# Patient Record
Sex: Female | Born: 1985
Health system: Southern US, Community
[De-identification: ages and names within clinical notes are randomized; demographics above are authoritative.]

## PROBLEM LIST (undated history)

## (undated) DIAGNOSIS — D649 Anemia, unspecified: Secondary | ICD-10-CM

## (undated) HISTORY — PX: NO PAST SURGERIES: SHX2092

---

## 2014-12-03 NOTE — L&D Delivery Note (Signed)
0230: In room to start pushing with patient.  Patient remain C/C/+1 despite laboring down for one hour.  Patient instructed on pushing methods and displayed great efforts.  However, infant noted to have variable decelerations with every contraction and after 30 minutes resuscitative measures were initiated due to a prolonged deceleration. At this time, Dr. Katharine LookJ. Ozan called for possible vacuum extraction and patient and husband given r/b of procedure. Pushing efforts stopped and infant heart rate recovered, but was noted to be tachycardiac. Dr. Katharine LookJ. Ozan arrived after ~ 10 minutes and assessed patient and reviewed r/b of vacuum extraction. At that time, patient encouraged to continue pushing and delivered as below with staff and husband support.  Dr. Katharine LookJ. Ozan remained at bedside for support, but this provider completed delivery.    Delivery Note At 3:40 AM, on July 8th, 2016, a viable female "Name Undecided" was delivered via Vaginal, Spontaneous Delivery (Presentation: Direct Occiput Posterior with restitution to LOP).  Shoulders delivered easily and infant with good tone and spontaneous cry. Tactile stimulation and bulb suction given by provider and infant placed on mother's abdomen where nurse continued tactile stimulation. Infant APGAR: 8,9. Cord clamped, cut, and blood collected. Placenta delivered spontaneously and noted to be intact with 3VC upon inspection.  Vaginal inspection revealed a 2nd degree laceration, which was confirmed by Dr. Katharine LookJ. Ozan. Repair made without need for additional anesthetic and 2.0 vicryl on a CT and 3.0 vicryl on SH and CT.  Patient tolerated well. Fundus firm, at the umbilicus, and bleeding small.  Mother hemodynamically stable and infant skin to skin prior to provider exit.  Mother desires birth control pills for contraception method and opts to breastfeed.   Family unsure of circumcision plans as of current. Infant weight at one hour of life: 9lbs 4.9oz.    Anesthesia: Epidural   Episiotomy:  None Lacerations: 2nd degree;Perineal Suture Repair: 3.0 vicryl Est. Blood Loss (mL):  72  Mom to postpartum.  Baby to Couplet care / Skin to Skin.  Wendi Lastra LYNN MSN, CNM 06/10/2015, 4:36 AM

## 2015-01-14 LAB — OB RESULTS CONSOLE RUBELLA ANTIBODY, IGM: Rubella: IMMUNE

## 2015-01-14 LAB — OB RESULTS CONSOLE HIV ANTIBODY (ROUTINE TESTING): HIV: NONREACTIVE

## 2015-01-14 LAB — OB RESULTS CONSOLE HEPATITIS B SURFACE ANTIGEN: Hepatitis B Surface Ag: NEGATIVE

## 2015-01-14 LAB — OB RESULTS CONSOLE RPR: RPR: NONREACTIVE

## 2015-01-14 LAB — OB RESULTS CONSOLE ABO/RH: RH TYPE: POSITIVE

## 2015-01-14 LAB — OB RESULTS CONSOLE GC/CHLAMYDIA
Chlamydia: NEGATIVE
Gonorrhea: NEGATIVE

## 2015-01-14 LAB — OB RESULTS CONSOLE ANTIBODY SCREEN: Antibody Screen: NEGATIVE

## 2015-03-03 ENCOUNTER — Other Ambulatory Visit (HOSPITAL_COMMUNITY): Payer: Self-pay | Admitting: Obstetrics and Gynecology

## 2015-03-03 DIAGNOSIS — IMO0002 Reserved for concepts with insufficient information to code with codable children: Secondary | ICD-10-CM

## 2015-03-03 DIAGNOSIS — Z0489 Encounter for examination and observation for other specified reasons: Secondary | ICD-10-CM

## 2015-03-03 DIAGNOSIS — O0932 Supervision of pregnancy with insufficient antenatal care, second trimester: Secondary | ICD-10-CM

## 2015-03-03 DIAGNOSIS — O359XX0 Maternal care for (suspected) fetal abnormality and damage, unspecified, not applicable or unspecified: Secondary | ICD-10-CM

## 2015-03-17 ENCOUNTER — Ambulatory Visit (HOSPITAL_COMMUNITY): Payer: BLUE CROSS/BLUE SHIELD

## 2015-05-20 LAB — OB RESULTS CONSOLE GBS: GBS: POSITIVE

## 2015-06-09 ENCOUNTER — Encounter (HOSPITAL_COMMUNITY): Payer: Self-pay

## 2015-06-09 ENCOUNTER — Inpatient Hospital Stay (HOSPITAL_COMMUNITY)
Admission: AD | Admit: 2015-06-09 | Discharge: 2015-06-11 | DRG: 775 | Disposition: A | Discharge status: Home / Self Care | Payer: BLUE CROSS/BLUE SHIELD | Source: Ambulatory Visit | Attending: Obstetrics & Gynecology | Admitting: Obstetrics & Gynecology

## 2015-06-09 DIAGNOSIS — O0933 Supervision of pregnancy with insufficient antenatal care, third trimester: Secondary | ICD-10-CM

## 2015-06-09 DIAGNOSIS — D649 Anemia, unspecified: Secondary | ICD-10-CM | POA: Diagnosis present

## 2015-06-09 DIAGNOSIS — O9902 Anemia complicating childbirth: Secondary | ICD-10-CM | POA: Diagnosis present

## 2015-06-09 DIAGNOSIS — O3663X0 Maternal care for excessive fetal growth, third trimester, not applicable or unspecified: Secondary | ICD-10-CM | POA: Diagnosis present

## 2015-06-09 DIAGNOSIS — B951 Streptococcus, group B, as the cause of diseases classified elsewhere: Secondary | ICD-10-CM | POA: Diagnosis present

## 2015-06-09 DIAGNOSIS — O99824 Streptococcus B carrier state complicating childbirth: Secondary | ICD-10-CM | POA: Diagnosis present

## 2015-06-09 DIAGNOSIS — Z3A39 39 weeks gestation of pregnancy: Secondary | ICD-10-CM | POA: Diagnosis present

## 2015-06-09 HISTORY — DX: Anemia, unspecified: D64.9

## 2015-06-09 NOTE — H&P (Signed)
Michele Gray is a 29 y.o. female, G3P2002 at 39.3 weeks, presenting for LOF and contractions.  Patient report SROM at 2315 with onset of regular contractions while en route to the hospital.  Patient is GBS positive.  Patient desires epidural for pain mgmt.   Patient Active Problem List   Diagnosis Date Noted  . Active labor at term 06/10/2015    History of present pregnancy: Patient entered care at 9.1 weeks, but only went for initial visit and received PE and dating US at facility in [place].  Patient transferred to [facility] at 19.3wks   [facility] by 9.1wk US on 11/09/2014.   Anatomy scan:  22.1 weeks, with normal findings and an anterior placenta.   Additional US evaluations:   -Anatomy scan. EFW= 1 lb 4 oz. = 89%tile. FHT's 148.Vertex pres. Anterior placenta. No previa. Placental edge to cx= 13.0 cm. PCI seen today.. Normal fluid.AP pocket=5.7 cm. Meas: are c/w established GA.Marland Kitchen. Anatomy: Open hands, 5th digit , NB seen. AA and DA seen.. Parents do not want to know gender. Anatomy not well seen due to fetal position: spine and CSP.Marland Kitchen. Cx closed. Adnexas unremarkable.   -25.1wks: F/U US: SIUP, NORMAL FLUID, CX 4.37 CM, GROWTH 81%, CAVUM SEPTUM PELLUCIDUM NOT CLEARLY SEEN, BOTH VENTRICLES ARE UPPER LIMITS NORMAL SIZE -36.4wks: U/S today vertex, normal fluid. EFW 93% Significant prenatal events:  [facility]  Patient c/o daily irregular contractions between 7-10 pm and leg cramping. Pt. voiced that feet are swelling more than usual, right ankle was hurting and describes as shooting pains up legs. Pt c/o occ pelvic/ lower back pain. Patient failed 1 hr GTT, but had a normal 3 hr.  Last evaluation:  06/09/2015   39 wks 3 days   By Dr. Lance MorinA.Roberts FHR: 146, SVE: 4cm / 80% / -2 BP: 112/60  Wt: 171 lbs  OB History    Gravida Para Term Preterm AB TAB SAB Ectopic Multiple Living   3 2 2       2     04/2011 Female-SVD with Epidural 05/2013 Female-SVD with Epidural  No past medical history  on file. No past surgical history on file. Family History: family history is not on file. Social History:  has no tobacco, alcohol, and drug history on file.   Prenatal Transfer Tool  Maternal Diabetes: No Genetic Screening: Normal Maternal Ultrasounds/Referrals: Normal, but limited and MFM consult recommended, but patient declined Fetal Ultrasounds or other Referrals:  None Maternal Substance Abuse:  No Significant Maternal Medications:  None Significant Maternal Lab Results: Lab values include: Group B Strep positive    ROS:  +Ctx, +LoF, +FM, -VB  Allergies  Allergen Reactions  . Flagyl [Metronidazole]        Blood pressure 123/70, pulse 69, temperature 98.1 F (36.7 C), temperature source Oral, resp. rate 18.  Physical Exam  Vitals reviewed. Constitutional: She is oriented to person, place, and time. She appears well-developed and well-nourished. She appears distressed (During Ctx).  HENT:  Head: Normocephalic and atraumatic.  Eyes: EOM are normal.  Neck: Normal range of motion.  Cardiovascular: Normal rate.   Respiratory: Effort normal.  GI: Soft.  Musculoskeletal: Normal range of motion. She exhibits no edema.  Neurological: She is alert and oriented to person, place, and time.  Skin: Skin is warm and dry.   SVE: 6/80/-2 +Fern--SROM at 2315 FHR: 145 bpm, Mod Var, -Decels, +Accels UCs:  Q1-563min, palpates moderate  Prenatal labs: ABO, Rh: A/Positive/-- (02/12 0000) Antibody:  Negative (02/12 0000) Rubella:   Immune RPR: Nonreactive (02/12 0000)  HBsAg: Negative (02/12 0000)  HIV: Non-reactive (02/12 0000)  GBS: Positive (06/17 0000) Sickle cell/Hgb electrophoresis:  N/A Pap:  Unknown GC:  Negative Chlamydia:  Negative Other:  TSH-Normal    Assessment IUP at 39.4wks Cat I FT GBS Positive Active Labor SROM Anemia  Plan: Admit to [employer] Routine Labor and Delivery Orders per [facility] Protocol In room to complete assessment and discuss  POC: -Okay for epidural -Will Vancomycin for GBS prophylaxis Dr. Katharine Look to be updated as appropriate  Phillips Climes, MSN 06/09/2015, 11:41 PM

## 2015-06-10 ENCOUNTER — Inpatient Hospital Stay (HOSPITAL_COMMUNITY): Payer: BLUE CROSS/BLUE SHIELD | Admitting: Anesthesiology

## 2015-06-10 ENCOUNTER — Encounter (HOSPITAL_COMMUNITY): Payer: Self-pay | Admitting: *Deleted

## 2015-06-10 LAB — CBC
HCT: 36 % (ref 36.0–46.0)
HEMOGLOBIN: 12 g/dL (ref 12.0–15.0)
MCH: 30 pg (ref 26.0–34.0)
MCHC: 33.3 g/dL (ref 30.0–36.0)
MCV: 90 fL (ref 78.0–100.0)
Platelets: 138 10*3/uL — ABNORMAL LOW (ref 150–400)
RBC: 4 MIL/uL (ref 3.87–5.11)
RDW: 14.1 % (ref 11.5–15.5)
WBC: 8.9 10*3/uL (ref 4.0–10.5)

## 2015-06-10 LAB — TYPE AND SCREEN
ABO/RH(D): A POS
Antibody Screen: NEGATIVE

## 2015-06-10 LAB — ABO/RH: ABO/RH(D): A POS

## 2015-06-10 LAB — RPR: RPR: NONREACTIVE

## 2015-06-10 MED ORDER — ONDANSETRON HCL 4 MG PO TABS: 4.0000 mg | ORAL_TABLET | ORAL | Status: DC | PRN

## 2015-06-10 MED ORDER — SENNOSIDES-DOCUSATE SODIUM 8.6-50 MG PO TABS
2.0000 | ORAL_TABLET | ORAL | Status: DC
  Administered 2015-06-10: 2 via ORAL
  Filled 2015-06-10: qty 2

## 2015-06-10 MED ORDER — DIPHENHYDRAMINE HCL 50 MG/ML IJ SOLN: 12.5000 mg | INTRAMUSCULAR | Status: DC | PRN

## 2015-06-10 MED ORDER — AMPICILLIN SODIUM 2 G IJ SOLR
2.0000 g | Freq: Once | INTRAMUSCULAR | Status: AC
  Administered 2015-06-10: 2 g via INTRAVENOUS
  Filled 2015-06-10: qty 2000

## 2015-06-10 MED ORDER — FENTANYL 2.5 MCG/ML BUPIVACAINE 1/10 % EPIDURAL INFUSION (WH - ANES)
14.0000 mL/h | INTRAMUSCULAR | Status: DC | PRN
  Administered 2015-06-10: 12 mL/h via EPIDURAL

## 2015-06-10 MED ORDER — IBUPROFEN 600 MG PO TABS
600.0000 mg | ORAL_TABLET | Freq: Four times a day (QID) | ORAL | Status: DC
  Administered 2015-06-10 – 2015-06-11 (×6): 600 mg via ORAL
  Filled 2015-06-10 (×6): qty 1

## 2015-06-10 MED ORDER — ACETAMINOPHEN 325 MG PO TABS: 650.0000 mg | ORAL_TABLET | ORAL | Status: DC | PRN

## 2015-06-10 MED ORDER — LIDOCAINE HCL (PF) 1 % IJ SOLN
30.0000 mL | INTRAMUSCULAR | Status: DC | PRN
  Filled 2015-06-10: qty 30

## 2015-06-10 MED ORDER — TETANUS-DIPHTH-ACELL PERTUSSIS 5-2.5-18.5 LF-MCG/0.5 IM SUSP: 0.5000 mL | Freq: Once | INTRAMUSCULAR | Status: DC

## 2015-06-10 MED ORDER — ONDANSETRON HCL 4 MG/2ML IJ SOLN: 4.0000 mg | Freq: Four times a day (QID) | INTRAMUSCULAR | Status: DC | PRN

## 2015-06-10 MED ORDER — OXYCODONE-ACETAMINOPHEN 5-325 MG PO TABS: 2.0000 | ORAL_TABLET | ORAL | Status: DC | PRN

## 2015-06-10 MED ORDER — LACTATED RINGERS IV SOLN: 500.0000 mL | INTRAVENOUS | Status: DC | PRN

## 2015-06-10 MED ORDER — LIDOCAINE HCL (PF) 1 % IJ SOLN
INTRAMUSCULAR | Status: AC
  Administered 2015-06-10: 30 mL
  Filled 2015-06-10: qty 30

## 2015-06-10 MED ORDER — SIMETHICONE 80 MG PO CHEW: 80.0000 mg | CHEWABLE_TABLET | ORAL | Status: DC | PRN

## 2015-06-10 MED ORDER — ZOLPIDEM TARTRATE 5 MG PO TABS: 5.0000 mg | ORAL_TABLET | Freq: Every evening | ORAL | Status: DC | PRN

## 2015-06-10 MED ORDER — FENTANYL CITRATE (PF) 100 MCG/2ML IJ SOLN: 50.0000 ug | INTRAMUSCULAR | Status: DC | PRN

## 2015-06-10 MED ORDER — OXYCODONE-ACETAMINOPHEN 5-325 MG PO TABS: 1.0000 | ORAL_TABLET | ORAL | Status: DC | PRN

## 2015-06-10 MED ORDER — DIPHENHYDRAMINE HCL 25 MG PO CAPS: 25.0000 mg | ORAL_CAPSULE | Freq: Four times a day (QID) | ORAL | Status: DC | PRN

## 2015-06-10 MED ORDER — OXYTOCIN BOLUS FROM INFUSION
500.0000 mL | INTRAVENOUS | Status: DC
  Administered 2015-06-10: 500 mL via INTRAVENOUS

## 2015-06-10 MED ORDER — OXYTOCIN 40 UNITS IN LACTATED RINGERS INFUSION - SIMPLE MED: 62.5000 mL/h | INTRAVENOUS | Status: DC

## 2015-06-10 MED ORDER — BENZOCAINE-MENTHOL 20-0.5 % EX AERO
1.0000 "application " | INHALATION_SPRAY | CUTANEOUS | Status: DC | PRN
  Filled 2015-06-10: qty 56

## 2015-06-10 MED ORDER — CITRIC ACID-SODIUM CITRATE 334-500 MG/5ML PO SOLN: 30.0000 mL | ORAL | Status: DC | PRN

## 2015-06-10 MED ORDER — DIBUCAINE 1 % RE OINT: 1.0000 "application " | TOPICAL_OINTMENT | RECTAL | Status: DC | PRN

## 2015-06-10 MED ORDER — EPHEDRINE 5 MG/ML INJ
10.0000 mg | INTRAVENOUS | Status: DC | PRN
  Filled 2015-06-10: qty 2

## 2015-06-10 MED ORDER — PHENYLEPHRINE 40 MCG/ML (10ML) SYRINGE FOR IV PUSH (FOR BLOOD PRESSURE SUPPORT)
80.0000 ug | PREFILLED_SYRINGE | INTRAVENOUS | Status: DC | PRN
  Filled 2015-06-10: qty 2

## 2015-06-10 MED ORDER — WITCH HAZEL-GLYCERIN EX PADS: 1.0000 "application " | MEDICATED_PAD | CUTANEOUS | Status: DC | PRN

## 2015-06-10 MED ORDER — BUPIVACAINE HCL (PF) 0.25 % IJ SOLN
INTRAMUSCULAR | Status: DC | PRN
  Administered 2015-06-10 (×2): 4 mL

## 2015-06-10 MED ORDER — ONDANSETRON HCL 4 MG/2ML IJ SOLN: 4.0000 mg | INTRAMUSCULAR | Status: DC | PRN

## 2015-06-10 MED ORDER — PHENYLEPHRINE 40 MCG/ML (10ML) SYRINGE FOR IV PUSH (FOR BLOOD PRESSURE SUPPORT)
PREFILLED_SYRINGE | INTRAVENOUS | Status: AC
  Filled 2015-06-10: qty 20

## 2015-06-10 MED ORDER — LACTATED RINGERS IV SOLN: INTRAVENOUS | Status: DC

## 2015-06-10 MED ORDER — FENTANYL 2.5 MCG/ML BUPIVACAINE 1/10 % EPIDURAL INFUSION (WH - ANES)
INTRAMUSCULAR | Status: AC
  Filled 2015-06-10: qty 125

## 2015-06-10 MED ORDER — PRENATAL MULTIVITAMIN CH
1.0000 | ORAL_TABLET | Freq: Every day | ORAL | Status: DC
  Administered 2015-06-10 – 2015-06-11 (×2): 1 via ORAL
  Filled 2015-06-10 (×2): qty 1

## 2015-06-10 MED ORDER — LIDOCAINE-EPINEPHRINE (PF) 2 %-1:200000 IJ SOLN
INTRAMUSCULAR | Status: DC | PRN
  Administered 2015-06-10: 3 mL

## 2015-06-10 MED ORDER — OXYTOCIN 40 UNITS IN LACTATED RINGERS INFUSION - SIMPLE MED
INTRAVENOUS | Status: AC
  Filled 2015-06-10: qty 1000

## 2015-06-10 MED ORDER — LANOLIN HYDROUS EX OINT: TOPICAL_OINTMENT | CUTANEOUS | Status: DC | PRN

## 2015-06-10 NOTE — Anesthesia Preprocedure Evaluation (Signed)

## 2015-06-10 NOTE — Progress Notes (Signed)
Erline HauRuhani Davidoff MRN: [id number]  Subjective: -Patient reports comfort with epidural.  Denies feeling of vaginal and/or rectal pressure.    Objective: BP 115/70 mmHg  Pulse 58  Temp(Src) 98.1 F (36.7 C) (Oral)  Resp 18  Ht 5\' 6"  (1.676 m)  Wt 77.111 kg (170 lb)  BMI 27.45 kg/m2  SpO2 99%     FHT: 150 bpm, Mod Var, -Decels, +Accels UC: Q1-643min, palpates moderate   SVE:   Dilation: 10 Effacement (%): 100 Station: +1 Exam by:: Willadene Mounsey, CNM Membranes:SROM Pitocin:None  Assessment:  IUP at 39.4wks Cat I FT  Active Labor-2nd Stage  Plan: -Allow to labor down for 30-60 min -Continue other mgmt as ordered -Anticipate SVD  Cassara Nida LYNN,MSN, CNM 06/10/2015, 1:33 AM

## 2015-06-10 NOTE — Lactation Note (Signed)
This note was copied from the chart of Boy Marithza Alkins. Lactation Consultation Note  Patient Name: Boy Erline HauRuhani Schlag ZOXWR'UToday's Date: 06/10/2015 Reason for consult: Initial assessment Baby 6 hours old. Mom reports that she nursed her first two children over 6 months. Mom states that she ended up with sore, cracked, and bleeding nipples with second child, so she will call for assistance with next feeding for someone to see a latch. Enc mom to offer lots of STS and nurse with cues. Mom given [facility] brochure, aware of OP/BFSG, community resources, and [facility] phone [facility] assistance after D/C.   Maternal Data Has patient been taught Hand Expression?: Yes (Per mom.) Does the patient have breastfeeding experience prior to this delivery?: Yes  Feeding Feeding Type: Breast Fed Length of feed: 15 min  LATCH Score/Interventions                      Lactation Tools Discussed/Used     Consult Status Consult Status: Follow-up Date: 06/11/15 Follow-up type: In-patient    Geralynn OchsWILLIARD, Flo Berroa 06/10/2015, 10:39 AM

## 2015-06-10 NOTE — Lactation Note (Signed)
This note was copied from the chart of Michele [name]. Lactation Consultation Note  Patient Name: Michele [name]: 06/10/2015 Reason for consult: Follow-up assessment Baby 7 hours old. Mom called for assistance with latching. Assisted mom to latch baby in cross-cradle position to right breast. Baby latch deeply after two attempts, suckling rhythmically, with a few swallows noted. Demonstrated to parents how to flange lower lip and mom reported increased comfort. Enc mom to nurse with cues, and discussed cluster-feeding. Enc mom to call for assistance as needed.   Maternal Data Has patient been taught Hand Expression?: Yes (Per mom.) Does the patient have breastfeeding experience prior to this delivery?: Yes  Feeding Feeding Type: Breast Fed  LATCH Score/Interventions Latch: Grasps breast easily, tongue down, lips flanged, rhythmical sucking.  Audible Swallowing: A few with stimulation  Type of Nipple: Everted at rest and after stimulation  Comfort (Breast/Nipple): Soft / non-tender     Hold (Positioning): Assistance needed to correctly position infant at breast and maintain latch. Intervention(s): Breastfeeding basics reviewed;Support Pillows;Position options;Skin to skin  LATCH Score: 8  Lactation Tools Discussed/Used     Consult Status Consult Status: Follow-up [name]: 06/11/15 Follow-up type: In-patient    Geralynn OchsWILLIARD, Sueann Brownley 06/10/2015, 11:25 AM

## 2015-06-10 NOTE — Anesthesia Procedure Notes (Signed)
Epidural Patient location during procedure: OB  Staffing Anesthesiologist: Rosette Bellavance, CHRIS Performed by: anesthesiologist   Preanesthetic Checklist Completed: patient identified, surgical consent, pre-op evaluation, timeout performed, IV checked, risks and benefits discussed and monitors and equipment checked  Epidural Patient position: sitting Prep: site prepped and draped and DuraPrep Patient monitoring: heart rate, cardiac monitor, continuous pulse ox and blood pressure Approach: midline Location: L5-S1 Injection technique: LOR saline  Needle:  Needle type: Tuohy  Needle gauge: 17 G Needle length: 9 cm Needle insertion depth: 6 cm Catheter type: closed end flexible Catheter size: 19 Gauge Catheter at skin depth: 12 cm Test dose: negative and 2% lidocaine with Epi 1:200 K  Assessment Events: blood not aspirated, injection not painful, no injection resistance, negative IV test and no paresthesia  Additional Notes H+P and labs checked, risks and benefits discussed with the patient, consent obtained, procedure tolerated well and without complications.  Reason for block:procedure for pain   

## 2015-06-10 NOTE — Progress Notes (Signed)
Patient complaining of left eyelid being swollen, itchy. Minimal swelling of left upper eyelid noted, very slight redness;, minimal redness of proximal sclera. Vision wnl, pupils equal. Recommended warm compress for comfort and will  Monitor this evening

## 2015-06-11 DIAGNOSIS — B951 Streptococcus, group B, as the cause of diseases classified elsewhere: Secondary | ICD-10-CM | POA: Diagnosis present

## 2015-06-11 LAB — CBC
HCT: 30.6 % — ABNORMAL LOW (ref 36.0–46.0)
Hemoglobin: 9.8 g/dL — ABNORMAL LOW (ref 12.0–15.0)
MCH: 29.5 pg (ref 26.0–34.0)
MCHC: 32 g/dL (ref 30.0–36.0)
MCV: 92.2 fL (ref 78.0–100.0)
Platelets: 119 10*3/uL — ABNORMAL LOW (ref 150–400)
RBC: 3.32 MIL/uL — ABNORMAL LOW (ref 3.87–5.11)
RDW: 14.3 % (ref 11.5–15.5)
WBC: 10.6 10*3/uL — AB (ref 4.0–10.5)

## 2015-06-11 MED ORDER — IBUPROFEN 600 MG PO TABS: 600.0000 mg | ORAL_TABLET | Freq: Four times a day (QID) | ORAL | Status: DC | PRN

## 2015-06-11 MED ORDER — NORETHINDRONE 0.35 MG PO TABS
1.0000 | ORAL_TABLET | Freq: Every day | ORAL | Status: DC
Start: 2015-06-11 — End: 2023-10-07

## 2015-06-11 MED ORDER — FERROUS SULFATE 325 (65 FE) MG PO TABS: 325.0000 mg | ORAL_TABLET | Freq: Every day | ORAL | Status: DC

## 2015-06-11 NOTE — Discharge Instructions (Signed)
Postpartum Care After Vaginal Delivery °After you deliver your newborn (postpartum period), the usual stay in the hospital is 24-72 hours. If there were problems with your labor or delivery, or if you have other medical problems, you might be in the hospital longer.  °While you are in the hospital, you will receive help and instructions on how to care for yourself and your newborn during the postpartum period.  °While you are in the hospital: °· Be sure to tell your nurses if you have pain or discomfort, as well as where you feel the pain and what makes the pain worse. °· If you had an incision made near your vagina (episiotomy) or if you had some tearing during delivery, the nurses may put ice packs on your episiotomy or tear. The ice packs may help to reduce the pain and swelling. °· If you are breastfeeding, you may feel uncomfortable contractions of your uterus for a couple of weeks. This is normal. The contractions help your uterus get back to normal size. °· It is normal to have some bleeding after delivery. °¨ For the first 1-3 days after delivery, the flow is red and the amount may be similar to a period. °¨ It is common for the flow to start and stop. °¨ In the first few days, you may pass some small clots. Let your nurses know if you begin to pass large clots or your flow increases. °¨ Do not  flush blood clots down the toilet before having the nurse look at them. °¨ During the next 3-10 days after delivery, your flow should become more watery and pink or brown-tinged in color. °¨ Ten to fourteen days after delivery, your flow should be a small amount of yellowish-white discharge. °¨ The amount of your flow will decrease over the first few weeks after delivery. Your flow may stop in 6-8 weeks. Most women have had their flow stop by 12 weeks after delivery. °· You should change your sanitary pads frequently. °· Wash your hands thoroughly with soap and water for at least 20 seconds after changing pads, using  the toilet, or before holding or feeding your newborn. °· You should feel like you need to empty your bladder within the first 6-8 hours after delivery. °· In case you become weak, lightheaded, or faint, call your nurse before you get out of bed for the first time and before you take a shower for the first time. °· Within the first few days after delivery, your breasts may begin to feel tender and full. This is called engorgement. Breast tenderness usually goes away within 48-72 hours after engorgement occurs. You may also notice milk leaking from your breasts. If you are not breastfeeding, do not stimulate your breasts. Breast stimulation can make your breasts produce more milk. °· Spending as much time as possible with your newborn is very important. During this time, you and your newborn can feel close and get to know each other. Having your newborn stay in your room (rooming in) will help to strengthen the bond with your newborn.  It will give you time to get to know your newborn and become comfortable caring for your newborn. °· Your hormones change after delivery. Sometimes the hormone changes can temporarily cause you to feel sad or tearful. These feelings should not last more than a few days. If these feelings last longer than that, you should talk to your caregiver. °· If desired, talk to your caregiver about methods of family planning or contraception. °·   Talk to your caregiver about immunizations. Your caregiver may want you to have the following immunizations before leaving the hospital:  Tetanus, diphtheria, and pertussis (Tdap) or tetanus and diphtheria (Td) immunization. It is very important that you and your family (including grandparents) or others caring for your newborn are up-to-date with the Tdap or Td immunizations. The Tdap or Td immunization can help protect your newborn from getting ill.  Rubella immunization.  Varicella (chickenpox) immunization.  Influenza immunization. You should  receive this annual immunization if you did not receive the immunization during your pregnancy. Document Released: 09/16/2007 Document Revised: 08/13/2012 Document Reviewed: 07/16/2012 [facility] Patient Information 2015 [employer] [place], [place]. This information is not intended to replace advice given to you by your health care provider. Make sure you discuss any questions you have with your health care provider.  Levonorgestrel intrauterine device (IUD) What is this medicine? LEVONORGESTREL IUD (LEE voe nor jes trel) is a contraceptive (birth control) device. The device is placed inside the uterus by a healthcare professional. It is used to prevent pregnancy and can also be used to treat heavy bleeding that occurs during your period. Depending on the device, it can be used for 3 to 5 years. This medicine may be used for other purposes; ask your health care provider or pharmacist if you have questions. COMMON BRAND NAME(S): Elveria RoyalsLILETTA, Mirena, Skyla What should I tell my health care provider before I take this medicine? They need to know if you have any of these conditions: -abnormal Pap smear -cancer of the breast, uterus, or cervix -diabetes -endometritis -genital or pelvic infection now or in the past -have more than one sexual partner or your partner has more than one partner -heart disease -history of an ectopic or tubal pregnancy -immune system problems -IUD in place -liver disease or tumor -problems with blood clots or take blood-thinners -use intravenous drugs -uterus of unusual shape -vaginal bleeding that has not been explained -an unusual or allergic reaction to levonorgestrel, other hormones, silicone, or polyethylene, medicines, foods, dyes, or preservatives -pregnant or trying to get pregnant -breast-feeding How should I use this medicine? This device is placed inside the uterus by a health care professional. Talk to your pediatrician regarding the use of this medicine in children.  Special care may be needed. Overdosage: If you think you have taken too much of this medicine contact a poison control center or emergency room at once. NOTE: This medicine is only for you. Do not share this medicine with others. What if I miss a dose? This does not apply. What may interact with this medicine? Do not take this medicine with any of the following medications: -amprenavir -bosentan -fosamprenavir This medicine may also interact with the following medications: -aprepitant -barbiturate medicines for inducing sleep or treating seizures -bexarotene -griseofulvin -medicines to treat seizures like carbamazepine, ethotoin, felbamate, oxcarbazepine, phenytoin, topiramate -modafinil -pioglitazone -rifabutin -rifampin -rifapentine -some medicines to treat HIV infection like atazanavir, indinavir, lopinavir, nelfinavir, tipranavir, ritonavir -St. John's wort -warfarin This list may not describe all possible interactions. Give your health care provider a list of all the medicines, herbs, non-prescription drugs, or dietary supplements you use. Also tell them if you smoke, drink alcohol, or use illegal drugs. Some items may interact with your medicine. What should I watch for while using this medicine? Visit your doctor or health care professional for regular check ups. See your doctor if you or your partner has sexual contact with others, becomes HIV positive, or gets a sexual transmitted disease. This product  does not protect you against HIV infection (AIDS) or other sexually transmitted diseases. You can check the placement of the IUD yourself by reaching up to the top of your vagina with clean fingers to feel the threads. Do not pull on the threads. It is a good habit to check placement after each menstrual period. Call your doctor right away if you feel more of the IUD than just the threads or if you cannot feel the threads at all. The IUD may come out by itself. You may become  pregnant if the device comes out. If you notice that the IUD has come out use a backup birth control method like condoms and call your health care provider. Using tampons will not change the position of the IUD and are okay to use during your period. What side effects may I notice from receiving this medicine? Side effects that you should report to your doctor or health care professional as soon as possible: -allergic reactions like skin rash, itching or hives, swelling of the face, lips, or tongue -fever, flu-like symptoms -genital sores -high blood pressure -no menstrual period for 6 weeks during use -pain, swelling, warmth in the leg -pelvic pain or tenderness -severe or sudden headache -signs of pregnancy -stomach cramping -sudden shortness of breath -trouble with balance, talking, or walking -unusual vaginal bleeding, discharge -yellowing of the eyes or skin Side effects that usually do not require medical attention (report to your doctor or health care professional if they continue or are bothersome): -acne -breast pain -change in sex drive or performance -changes in weight -cramping, dizziness, or faintness while the device is being inserted -headache -irregular menstrual bleeding within first 3 to 6 months of use -nausea This list may not describe all possible side effects. Call your doctor for medical advice about side effects. You may report side effects to FDA at 1-800-FDA-1088. Where should I keep my medicine? This does not apply. NOTE: This sheet is a summary. It may not cover all possible information. If you have questions about this medicine, talk to your doctor, pharmacist, or health care provider.  2015, [employer] Standard. (2011-12-20 13:54:04)  Etonogestrel implant What is this medicine? ETONOGESTREL (et oh noe JES trel) is a contraceptive (birth control) device. It is used to prevent pregnancy. It can be used for up to 3 years. This medicine may be used  for other purposes; ask your health care provider or pharmacist if you have questions. COMMON BRAND NAME(S): Implanon, Nexplanon What should I tell my health care provider before I take this medicine? They need to know if you have any of these conditions: -abnormal vaginal bleeding -blood vessel disease or blood clots -cancer of the breast, cervix, or liver -depression -diabetes -gallbladder disease -headaches -heart disease or recent heart attack -high blood pressure -high cholesterol -kidney disease -liver disease -renal disease -seizures -tobacco smoker -an unusual or allergic reaction to etonogestrel, other hormones, anesthetics or antiseptics, medicines, foods, dyes, or preservatives -pregnant or trying to get pregnant -breast-feeding How should I use this medicine? This device is inserted just under the skin on the inner side of your upper arm by a health care professional. Talk to your pediatrician regarding the use of this medicine in children. Special care may be needed. Overdosage: If you think you've taken too much of this medicine contact a poison control center or emergency room at once. Overdosage: If you think you have taken too much of this medicine contact a poison control center or emergency room at  once. NOTE: This medicine is only for you. Do not share this medicine with others. What if I miss a dose? This does not apply. What may interact with this medicine? Do not take this medicine with any of the following medications: -amprenavir -bosentan -fosamprenavir This medicine may also interact with the following medications: -barbiturate medicines for inducing sleep or treating seizures -certain medicines for fungal infections like ketoconazole and itraconazole -griseofulvin -medicines to treat seizures like carbamazepine, felbamate, oxcarbazepine, phenytoin, topiramate -modafinil -phenylbutazone -rifampin -some medicines to treat HIV infection like  atazanavir, indinavir, lopinavir, nelfinavir, tipranavir, ritonavir -St. John's wort This list may not describe all possible interactions. Give your health care provider a list of all the medicines, herbs, non-prescription drugs, or dietary supplements you use. Also tell them if you smoke, drink alcohol, or use illegal drugs. Some items may interact with your medicine. What should I watch for while using this medicine? This product does not protect you against HIV infection (AIDS) or other sexually transmitted diseases. You should be able to feel the implant by pressing your fingertips over the skin where it was inserted. Tell your doctor if you cannot feel the implant. What side effects may I notice from receiving this medicine? Side effects that you should report to your doctor or health care professional as soon as possible: -allergic reactions like skin rash, itching or hives, swelling of the face, lips, or tongue -breast lumps -changes in vision -confusion, trouble speaking or understanding -dark urine -depressed mood -general ill feeling or flu-like symptoms -light-colored stools -loss of appetite, nausea -right upper belly pain -severe headaches -severe pain, swelling, or tenderness in the abdomen -shortness of breath, chest pain, swelling in a leg -signs of pregnancy -sudden numbness or weakness of the face, arm or leg -trouble walking, dizziness, loss of balance or coordination -unusual vaginal bleeding, discharge -unusually weak or tired -yellowing of the eyes or skin Side effects that usually do not require medical attention (Report these to your doctor or health care professional if they continue or are bothersome.): -acne -breast pain -changes in weight -cough -fever or chills -headache -irregular menstrual bleeding -itching, burning, and vaginal discharge -pain or difficulty passing urine -sore throat This list may not describe all possible side effects. Call your  doctor for medical advice about side effects. You may report side effects to FDA at 1-800-FDA-1088. Where should I keep my medicine? This drug is given in a hospital or clinic and will not be stored at home. NOTE: This sheet is a summary. It may not cover all possible information. If you have questions about this medicine, talk to your doctor, pharmacist, or health care provider.  2015, Else[employer] Standard. (2012-05-26 15:37:45)  Medroxyprogesterone injection [Contraceptive] What is this medicine? MEDROXYPROGESTERONE (me DROX ee proe JES te rone) contraceptive injections prevent pregnancy. They provide effective birth control for 3 months. Depo-subQ Provera 104 is also used for treating pain related to endometriosis. This medicine may be used for other purposes; ask your health care provider or pharmacist if you have questions. COMMON BRAND NAME(S): Depo-Provera, Depo-subQ Provera 104 What should I tell my health care provider before I take this medicine? They need to know if you have any of these conditions: -frequently drink alcohol -asthma -blood vessel disease or a history of a blood clot in the lungs or legs -bone disease such as osteoporosis -breast cancer -diabetes -eating disorder (anorexia nervosa or bulimia) -high blood pressure -HIV infection or AIDS -kidney disease -liver disease -mental depression -migraine -seizures (convulsions) -  stroke -tobacco smoker -vaginal bleeding -an unusual or allergic reaction to medroxyprogesterone, other hormones, medicines, foods, dyes, or preservatives -pregnant or trying to get pregnant -breast-feeding How should I use this medicine? Depo-Provera Contraceptive injection is given into a muscle. Depo-subQ Provera 104 injection is given under the skin. These injections are given by a health care professional. You must not be pregnant before getting an injection. The injection is usually given during the first 5 days after the start of a  menstrual period or 6 weeks after delivery of a baby. Talk to your pediatrician regarding the use of this medicine in children. Special care may be needed. These injections have been used in female children who have started having menstrual periods. Overdosage: If you think you have taken too much of this medicine contact a poison control center or emergency room at once. NOTE: This medicine is only for you. Do not share this medicine with others. What if I miss a dose? Try not to miss a dose. You must get an injection once every 3 months to maintain birth control. If you cannot keep an appointment, call and reschedule it. If you wait longer than 13 weeks between Depo-Provera contraceptive injections or longer than 14 weeks between Depo-subQ Provera 104 injections, you could get pregnant. Use another method for birth control if you miss your appointment. You may also need a pregnancy test before receiving another injection. What may interact with this medicine? Do not take this medicine with any of the following medications: -bosentan This medicine may also interact with the following medications: -aminoglutethimide -antibiotics or medicines for infections, especially rifampin, rifabutin, rifapentine, and griseofulvin -aprepitant -barbiturate medicines such as phenobarbital or primidone -bexarotene -carbamazepine -medicines for seizures like ethotoin, felbamate, oxcarbazepine, phenytoin, topiramate -modafinil -St. John's wort This list may not describe all possible interactions. Give your health care provider a list of all the medicines, herbs, non-prescription drugs, or dietary supplements you use. Also tell them if you smoke, drink alcohol, or use illegal drugs. Some items may interact with your medicine. What should I watch for while using this medicine? This drug does not protect you against HIV infection (AIDS) or other sexually transmitted diseases. Use of this product may cause you to  lose calcium from your bones. Loss of calcium may cause weak bones (osteoporosis). Only use this product for more than 2 years if other forms of birth control are not right for you. The longer you use this product for birth control the more likely you will be at risk for weak bones. Ask your health care professional how you can keep strong bones. You may have a change in bleeding pattern or irregular periods. Many females stop having periods while taking this drug. If you have received your injections on time, your chance of being pregnant is very low. If you think you may be pregnant, see your health care professional as soon as possible. Tell your health care professional if you want to get pregnant within the next year. The effect of this medicine may last a long time after you get your last injection. What side effects may I notice from receiving this medicine? Side effects that you should report to your doctor or health care professional as soon as possible: -allergic reactions like skin rash, itching or hives, swelling of the face, lips, or tongue -breast tenderness or discharge -breathing problems -changes in vision -depression -feeling faint or lightheaded, falls -fever -pain in the abdomen, chest, groin, or leg -problems with balance, talking,  walking -unusually weak or tired -yellowing of the eyes or skin Side effects that usually do not require medical attention (report to your doctor or health care professional if they continue or are bothersome): -acne -fluid retention and swelling -headache -irregular periods, spotting, or absent periods -temporary pain, itching, or skin reaction at site where injected -weight gain This list may not describe all possible side effects. Call your doctor for medical advice about side effects. You may report side effects to FDA at 1-800-FDA-1088. Where should I keep my medicine? This does not apply. The injection will be given to you by a health  care professional. NOTE: This sheet is a summary. It may not cover all possible information. If you have questions about this medicine, talk to your doctor, pharmacist, or health care provider.  2015, Elsevier/Gold Standard. (2008-12-10 18:37:56)

## 2015-06-11 NOTE — Lactation Note (Signed)
This note was copied from the chart of Michele Akasha Wessells. Lactation Consultation Note; Mom reports that baby was fussy through the night and she's not sure she has any milk. Baby asleep in bassinet and has not fed in 3 hours- suggested unwrapping baby and getting him skin to skin to see if he will nurse. Baby awakened and latched well- untucked bottom lip and mom reports tugging, no pain with latch. Reports she had very sore nipples with last baby and wants to do better this time. Reviewed cluster feeding and encouraged to feed whenever she sees feeding cues. No questions at present. To call prn  Patient Name: Michele Gray'UToday's Date: 06/11/2015 Reason for consult: Follow-up assessment   Maternal Data Formula Feeding for Exclusion: No Has patient been taught Hand Expression?: Yes Does the patient have breastfeeding experience prior to this delivery?: Yes  Feeding Feeding Type: Breast Fed  LATCH Score/Interventions Latch: Grasps breast easily, tongue down, lips flanged, rhythmical sucking.  Audible Swallowing: A few with stimulation  Type of Nipple: Everted at rest and after stimulation  Comfort (Breast/Nipple): Soft / non-tender     Hold (Positioning): Assistance needed to correctly position infant at breast and maintain latch. Intervention(s): Breastfeeding basics reviewed;Support Pillows  LATCH Score: 8  Lactation Tools Discussed/Used     Consult Status Consult Status: Follow-up Date: 06/12/15 Follow-up type: In-patient    Pamelia HoitWeeks, Adelee Hannula D 06/11/2015, 11:36 AM

## 2015-06-11 NOTE — Discharge Summary (Signed)
  Vaginal Delivery Discharge Summary  Michele Gray  DOB:    Oct 23, 1986 MRN:    [id number] CSN:    [id number]  Date of admission:                  06/09/15  Date of discharge:                   06/11/15  Procedures this admission:   SVB, repair of 2nd degree perineal laceration  Date of Delivery: 06/10/15  Newborn Data:  Live born female  Birth Weight: 9 lb 4.9 oz (4221 g) APGAR: 8, 9  Home with mother.  Circumcision Plan: Outpatient  History of Present Illness:  Michele Gray is a 29 y.o. female, G3P3003, who presents at 767w4d weeks gestation. [facility]. She was admitted for onset of labor and rupture of membranes. Her pregnancy has been complicated by:  Patient Active Problem List   Diagnosis Date Noted  . Positive GBS test 06/11/2015  . LGA (large for gestational age) infant 06/11/2015  . SVD (spontaneous vaginal delivery) 06/10/2015  . Second-degree perineal laceration, with delivery 06/10/2015     Hospital Course:   Admitting Dx:  IUP at 39 4/7 weeks, GBS positive, active labor, SROM GBS Status:  Positive Delivering Clinician: Gerrit HeckJessica Emly, CNM, with Dr. Charlotta Newtonzan at bedside for possible need for VE Lacerations/MLE: 2nd degree perineal laceration Complications: None  Intrapartum Procedures: spontaneous vaginal delivery Postpartum Procedures: none Complications-Operative and Postpartum: 2nd degree perineal laceration  Discharge Diagnoses: Term Pregnancy-delivered, SVB, GBS positive, mild anemia  Feeding:  breast  Contraception:  oral progesterone-only contraceptive--requested information on other methods.  Home with info on IUD, Nexplanon, and Depo in d/c instructions.  Hemoglobin Results:  CBC Latest Ref Rng 06/11/2015 06/09/2015  WBC 4.0 - 10.5 K/uL 10.6(H) 8.9  Hemoglobin 12.0 - 15.0 g/dL 8.4(X9.8(L) 32.412.0  Hematocrit 36.0 - 46.0 % 30.6(L) 36.0  Platelets 150 -  400 K/uL 119(L) 138(L)    Discharge Physical Exam:   General: alert Lochia: appropriate Uterine Fundus: firm Incision: healing well DVT Evaluation: No evidence of DVT seen on physical exam. Negative Homan's sign.   Discharge Information:  Activity:           pelvic rest Diet:                routine Medications: Ibuprofen and Iron--to continue Fe q day Condition:      stable Instructions:  Routine pp instructions   Discharge to: home  Follow-up Information    [facility]. Schedule an appointment as soon as possible for a visit in 6 weeks.   Specialty:  Obstetrics and Gynecology   Why:  Call to schedule baby's circumcision in next 1-2 weeks.  Call for any questions or concerns.   Contact information:   [address] [place] [phone]       Nigel BridgemanLATHAM, Michele Fabry CNM 06/11/2015 11:14 AM

## 2015-06-12 ENCOUNTER — Ambulatory Visit: Payer: Self-pay

## 2015-06-12 NOTE — Lactation Note (Signed)
This note was copied from the chart of Michele Gray. Lactation Consultation Note  Patient Name: Michele Gray VOZDG'UToday's Date: 06/12/2015 Reason for consult: Follow-up assessment   With this mom of a term baby, now 455 hours old. Mom's breasts are full and her milk is transitioning in. We reviewed breast care, and lactation services. Mom knows to call for questions/concerns after discharge.    Maternal Data    Feeding Feeding Type: Breast Fed Length of feed: 45 min  LATCH Score/Interventions Latch: Grasps breast easily, tongue down, lips flanged, rhythmical sucking.  Audible Swallowing: A few with stimulation  Type of Nipple: Everted at rest and after stimulation  Comfort (Breast/Nipple): Soft / non-tender     Hold (Positioning): No assistance needed to correctly position infant at breast.  LATCH Score: 9  Lactation Tools Discussed/Used     Consult Status Consult Status: Complete    Alfred LevinsLee, Zimal Weisensel Anne 06/12/2015, 10:52 AM

## 2015-06-18 NOTE — Anesthesia Postprocedure Evaluation (Signed)
  Anesthesia Post-op Note  Patient: Michele Gray  Procedure(s) Performed: * No procedures listed *  Patient Location: PACU and Mother/Baby  Anesthesia Type:Epidural  Level of Consciousness: awake  Airway and Oxygen Therapy: Patient Spontanous Breathing  Post-op Pain: none  Post-op Assessment: Post-op Vital signs reviewed              Post-op Vital Signs: stable  Last Vitals:  Filed Vitals:   06/11/15 0546  BP: 100/46  Pulse: 57  Temp: 36.7 C  Resp: 18    Complications: No apparent anesthesia complications

## 2016-04-03 DIAGNOSIS — Z30431 Encounter for routine checking of intrauterine contraceptive device: Secondary | ICD-10-CM | POA: Diagnosis not present

## 2016-05-02 DIAGNOSIS — Z0100 Encounter for examination of eyes and vision without abnormal findings: Secondary | ICD-10-CM | POA: Diagnosis not present

## 2016-09-11 DIAGNOSIS — Z01419 Encounter for gynecological examination (general) (routine) without abnormal findings: Secondary | ICD-10-CM | POA: Diagnosis not present

## 2016-09-11 DIAGNOSIS — R350 Frequency of micturition: Secondary | ICD-10-CM | POA: Diagnosis not present

## 2016-09-11 DIAGNOSIS — Z833 Family history of diabetes mellitus: Secondary | ICD-10-CM | POA: Diagnosis not present

## 2016-09-11 DIAGNOSIS — Z6822 Body mass index (BMI) 22.0-22.9, adult: Secondary | ICD-10-CM | POA: Diagnosis not present

## 2016-09-11 DIAGNOSIS — Z3009 Encounter for other general counseling and advice on contraception: Secondary | ICD-10-CM | POA: Diagnosis not present

## 2016-09-13 DIAGNOSIS — Z139 Encounter for screening, unspecified: Secondary | ICD-10-CM | POA: Diagnosis not present

## 2016-10-11 DIAGNOSIS — Z23 Encounter for immunization: Secondary | ICD-10-CM | POA: Diagnosis not present

## 2017-04-25 DIAGNOSIS — F419 Anxiety disorder, unspecified: Secondary | ICD-10-CM | POA: Diagnosis not present

## 2017-04-25 DIAGNOSIS — R5383 Other fatigue: Secondary | ICD-10-CM | POA: Diagnosis not present

## 2017-04-25 DIAGNOSIS — F320 Major depressive disorder, single episode, mild: Secondary | ICD-10-CM | POA: Diagnosis not present

## 2017-05-07 DIAGNOSIS — F320 Major depressive disorder, single episode, mild: Secondary | ICD-10-CM | POA: Diagnosis not present

## 2017-05-07 DIAGNOSIS — F419 Anxiety disorder, unspecified: Secondary | ICD-10-CM | POA: Diagnosis not present

## 2017-06-13 DIAGNOSIS — F419 Anxiety disorder, unspecified: Secondary | ICD-10-CM | POA: Diagnosis not present

## 2017-06-13 DIAGNOSIS — F320 Major depressive disorder, single episode, mild: Secondary | ICD-10-CM | POA: Diagnosis not present

## 2017-09-24 DIAGNOSIS — Z23 Encounter for immunization: Secondary | ICD-10-CM | POA: Diagnosis not present

## 2017-12-11 DIAGNOSIS — Z124 Encounter for screening for malignant neoplasm of cervix: Secondary | ICD-10-CM | POA: Diagnosis not present

## 2017-12-11 DIAGNOSIS — Z6821 Body mass index (BMI) 21.0-21.9, adult: Secondary | ICD-10-CM | POA: Diagnosis not present

## 2017-12-11 DIAGNOSIS — Z01419 Encounter for gynecological examination (general) (routine) without abnormal findings: Secondary | ICD-10-CM | POA: Diagnosis not present

## 2017-12-13 DIAGNOSIS — F419 Anxiety disorder, unspecified: Secondary | ICD-10-CM | POA: Diagnosis not present

## 2017-12-13 DIAGNOSIS — F320 Major depressive disorder, single episode, mild: Secondary | ICD-10-CM | POA: Diagnosis not present

## 2017-12-19 DIAGNOSIS — F324 Major depressive disorder, single episode, in partial remission: Secondary | ICD-10-CM | POA: Diagnosis not present

## 2017-12-23 DIAGNOSIS — F324 Major depressive disorder, single episode, in partial remission: Secondary | ICD-10-CM | POA: Diagnosis not present

## 2017-12-30 DIAGNOSIS — F324 Major depressive disorder, single episode, in partial remission: Secondary | ICD-10-CM | POA: Diagnosis not present

## 2018-01-02 DIAGNOSIS — F324 Major depressive disorder, single episode, in partial remission: Secondary | ICD-10-CM | POA: Diagnosis not present

## 2018-01-09 DIAGNOSIS — F324 Major depressive disorder, single episode, in partial remission: Secondary | ICD-10-CM | POA: Diagnosis not present

## 2018-01-27 DIAGNOSIS — F324 Major depressive disorder, single episode, in partial remission: Secondary | ICD-10-CM | POA: Diagnosis not present

## 2018-04-01 DIAGNOSIS — F324 Major depressive disorder, single episode, in partial remission: Secondary | ICD-10-CM | POA: Diagnosis not present

## 2018-04-24 DIAGNOSIS — F324 Major depressive disorder, single episode, in partial remission: Secondary | ICD-10-CM | POA: Diagnosis not present

## 2018-05-15 DIAGNOSIS — F324 Major depressive disorder, single episode, in partial remission: Secondary | ICD-10-CM | POA: Diagnosis not present

## 2018-05-19 DIAGNOSIS — F324 Major depressive disorder, single episode, in partial remission: Secondary | ICD-10-CM | POA: Diagnosis not present

## 2018-06-09 DIAGNOSIS — F324 Major depressive disorder, single episode, in partial remission: Secondary | ICD-10-CM | POA: Diagnosis not present

## 2018-06-12 DIAGNOSIS — F320 Major depressive disorder, single episode, mild: Secondary | ICD-10-CM | POA: Diagnosis not present

## 2018-06-12 DIAGNOSIS — F419 Anxiety disorder, unspecified: Secondary | ICD-10-CM | POA: Diagnosis not present

## 2018-07-03 DIAGNOSIS — F324 Major depressive disorder, single episode, in partial remission: Secondary | ICD-10-CM | POA: Diagnosis not present

## 2018-07-24 DIAGNOSIS — F324 Major depressive disorder, single episode, in partial remission: Secondary | ICD-10-CM | POA: Diagnosis not present

## 2018-08-28 DIAGNOSIS — F324 Major depressive disorder, single episode, in partial remission: Secondary | ICD-10-CM | POA: Diagnosis not present

## 2018-09-11 DIAGNOSIS — F324 Major depressive disorder, single episode, in partial remission: Secondary | ICD-10-CM | POA: Diagnosis not present

## 2018-10-09 DIAGNOSIS — F324 Major depressive disorder, single episode, in partial remission: Secondary | ICD-10-CM | POA: Diagnosis not present

## 2018-12-08 DIAGNOSIS — F4321 Adjustment disorder with depressed mood: Secondary | ICD-10-CM | POA: Diagnosis not present

## 2018-12-12 DIAGNOSIS — F419 Anxiety disorder, unspecified: Secondary | ICD-10-CM | POA: Diagnosis not present

## 2018-12-12 DIAGNOSIS — Z113 Encounter for screening for infections with a predominantly sexual mode of transmission: Secondary | ICD-10-CM | POA: Diagnosis not present

## 2018-12-12 DIAGNOSIS — F320 Major depressive disorder, single episode, mild: Secondary | ICD-10-CM | POA: Diagnosis not present

## 2018-12-15 DIAGNOSIS — F4321 Adjustment disorder with depressed mood: Secondary | ICD-10-CM | POA: Diagnosis not present

## 2018-12-22 DIAGNOSIS — F4321 Adjustment disorder with depressed mood: Secondary | ICD-10-CM | POA: Diagnosis not present

## 2019-01-05 DIAGNOSIS — F4321 Adjustment disorder with depressed mood: Secondary | ICD-10-CM | POA: Diagnosis not present

## 2019-01-19 DIAGNOSIS — F4321 Adjustment disorder with depressed mood: Secondary | ICD-10-CM | POA: Diagnosis not present

## 2019-01-21 DIAGNOSIS — J069 Acute upper respiratory infection, unspecified: Secondary | ICD-10-CM | POA: Diagnosis not present

## 2019-01-21 DIAGNOSIS — F419 Anxiety disorder, unspecified: Secondary | ICD-10-CM | POA: Diagnosis not present

## 2019-01-21 DIAGNOSIS — F320 Major depressive disorder, single episode, mild: Secondary | ICD-10-CM | POA: Diagnosis not present

## 2019-01-30 DIAGNOSIS — F4320 Adjustment disorder, unspecified: Secondary | ICD-10-CM | POA: Diagnosis not present

## 2019-02-06 DIAGNOSIS — F4320 Adjustment disorder, unspecified: Secondary | ICD-10-CM | POA: Diagnosis not present

## 2019-02-12 DIAGNOSIS — Z6821 Body mass index (BMI) 21.0-21.9, adult: Secondary | ICD-10-CM | POA: Diagnosis not present

## 2019-02-12 DIAGNOSIS — R5383 Other fatigue: Secondary | ICD-10-CM | POA: Diagnosis not present

## 2019-02-12 DIAGNOSIS — Z113 Encounter for screening for infections with a predominantly sexual mode of transmission: Secondary | ICD-10-CM | POA: Diagnosis not present

## 2019-02-12 DIAGNOSIS — Z01419 Encounter for gynecological examination (general) (routine) without abnormal findings: Secondary | ICD-10-CM | POA: Diagnosis not present

## 2019-02-12 DIAGNOSIS — R05 Cough: Secondary | ICD-10-CM | POA: Diagnosis not present

## 2019-02-26 DIAGNOSIS — F4321 Adjustment disorder with depressed mood: Secondary | ICD-10-CM | POA: Diagnosis not present

## 2019-03-02 ENCOUNTER — Other Ambulatory Visit: Payer: Self-pay

## 2019-03-02 ENCOUNTER — Encounter (HOSPITAL_COMMUNITY): Payer: Self-pay | Admitting: Emergency Medicine

## 2019-03-02 ENCOUNTER — Emergency Department (HOSPITAL_COMMUNITY)
Admission: EM | Admit: 2019-03-02 | Discharge: 2019-03-02 | Disposition: A | Discharge status: Home / Self Care | Payer: BLUE CROSS/BLUE SHIELD | Attending: Emergency Medicine | Admitting: Emergency Medicine

## 2019-03-02 DIAGNOSIS — Z79899 Other long term (current) drug therapy: Secondary | ICD-10-CM | POA: Diagnosis not present

## 2019-03-02 DIAGNOSIS — Y929 Unspecified place or not applicable: Secondary | ICD-10-CM | POA: Diagnosis not present

## 2019-03-02 DIAGNOSIS — S40022A Contusion of left upper arm, initial encounter: Secondary | ICD-10-CM | POA: Diagnosis not present

## 2019-03-02 DIAGNOSIS — Y999 Unspecified external cause status: Secondary | ICD-10-CM | POA: Insufficient documentation

## 2019-03-02 DIAGNOSIS — S199XXA Unspecified injury of neck, initial encounter: Secondary | ICD-10-CM | POA: Diagnosis not present

## 2019-03-02 DIAGNOSIS — S1093XA Contusion of unspecified part of neck, initial encounter: Secondary | ICD-10-CM | POA: Diagnosis not present

## 2019-03-02 DIAGNOSIS — Y9389 Activity, other specified: Secondary | ICD-10-CM | POA: Insufficient documentation

## 2019-03-02 DIAGNOSIS — M542 Cervicalgia: Secondary | ICD-10-CM | POA: Diagnosis not present
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
## 2019-03-05 ENCOUNTER — Ambulatory Visit (HOSPITAL_COMMUNITY)
Admission: RE | Admit: 2019-03-05 | Discharge: 2019-03-05 | Disposition: A | Discharge status: Home / Self Care | Payer: BLUE CROSS/BLUE SHIELD | Source: Ambulatory Visit | Attending: Family Medicine | Admitting: Family Medicine

## 2019-03-05 ENCOUNTER — Other Ambulatory Visit (HOSPITAL_COMMUNITY): Payer: Self-pay | Admitting: Family Medicine

## 2019-03-05 ENCOUNTER — Other Ambulatory Visit: Payer: Self-pay

## 2019-03-05 ENCOUNTER — Other Ambulatory Visit: Payer: Self-pay | Admitting: Family Medicine

## 2019-03-05 DIAGNOSIS — T148XXA Other injury of unspecified body region, initial encounter: Secondary | ICD-10-CM | POA: Diagnosis not present

## 2019-03-05 DIAGNOSIS — Y92009 Unspecified place in unspecified non-institutional (private) residence as the place of occurrence of the external cause: Secondary | ICD-10-CM | POA: Diagnosis not present

## 2019-03-05 DIAGNOSIS — N939 Abnormal uterine and vaginal bleeding, unspecified: Secondary | ICD-10-CM | POA: Insufficient documentation

## 2019-03-05 DIAGNOSIS — S3991XA Unspecified injury of abdomen, initial encounter: Secondary | ICD-10-CM | POA: Diagnosis not present

## 2019-03-30 DIAGNOSIS — F4321 Adjustment disorder with depressed mood: Secondary | ICD-10-CM | POA: Diagnosis not present

## 2019-04-10 DIAGNOSIS — F4321 Adjustment disorder with depressed mood: Secondary | ICD-10-CM | POA: Diagnosis not present

## 2019-04-10 DIAGNOSIS — F324 Major depressive disorder, single episode, in partial remission: Secondary | ICD-10-CM | POA: Diagnosis not present

## 2019-04-14 DIAGNOSIS — F4321 Adjustment disorder with depressed mood: Secondary | ICD-10-CM | POA: Diagnosis not present

## 2019-04-19 DIAGNOSIS — F324 Major depressive disorder, single episode, in partial remission: Secondary | ICD-10-CM | POA: Diagnosis not present

## 2019-04-20 DIAGNOSIS — R1033 Periumbilical pain: Secondary | ICD-10-CM | POA: Diagnosis not present

## 2019-04-20 DIAGNOSIS — R5383 Other fatigue: Secondary | ICD-10-CM | POA: Diagnosis not present

## 2019-04-20 DIAGNOSIS — F439 Reaction to severe stress, unspecified: Secondary | ICD-10-CM | POA: Diagnosis not present

## 2019-04-21 DIAGNOSIS — F4321 Adjustment disorder with depressed mood: Secondary | ICD-10-CM | POA: Diagnosis not present

## 2019-04-26 DIAGNOSIS — F324 Major depressive disorder, single episode, in partial remission: Secondary | ICD-10-CM | POA: Diagnosis not present

## 2019-04-30 DIAGNOSIS — F4321 Adjustment disorder with depressed mood: Secondary | ICD-10-CM | POA: Diagnosis not present
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
## 2019-07-03 DIAGNOSIS — F4321 Adjustment disorder with depressed mood: Secondary | ICD-10-CM | POA: Diagnosis not present

## 2019-07-10 DIAGNOSIS — F4321 Adjustment disorder with depressed mood: Secondary | ICD-10-CM | POA: Diagnosis not present

## 2019-10-07 DIAGNOSIS — Z87898 Personal history of other specified conditions: Secondary | ICD-10-CM | POA: Diagnosis not present

## 2019-10-07 DIAGNOSIS — F419 Anxiety disorder, unspecified: Secondary | ICD-10-CM | POA: Diagnosis not present

## 2019-10-07 DIAGNOSIS — F430 Acute stress reaction: Secondary | ICD-10-CM | POA: Diagnosis not present

## 2019-10-07 DIAGNOSIS — R6884 Jaw pain: Secondary | ICD-10-CM | POA: Diagnosis not present

## 2020-02-16 DIAGNOSIS — Z6821 Body mass index (BMI) 21.0-21.9, adult: Secondary | ICD-10-CM | POA: Diagnosis not present

## 2020-02-16 DIAGNOSIS — N898 Other specified noninflammatory disorders of vagina: Secondary | ICD-10-CM | POA: Diagnosis not present

## 2020-02-16 DIAGNOSIS — Z01411 Encounter for gynecological examination (general) (routine) with abnormal findings: Secondary | ICD-10-CM | POA: Diagnosis not present

## 2020-02-16 DIAGNOSIS — Z3040 Encounter for surveillance of contraceptives, unspecified: Secondary | ICD-10-CM | POA: Diagnosis not present

## 2020-10-20 DIAGNOSIS — Z975 Presence of (intrauterine) contraceptive device: Secondary | ICD-10-CM | POA: Insufficient documentation

## 2021-03-20 DIAGNOSIS — F4321 Adjustment disorder with depressed mood: Secondary | ICD-10-CM | POA: Diagnosis not present

## 2021-05-02 DIAGNOSIS — F4321 Adjustment disorder with depressed mood: Secondary | ICD-10-CM | POA: Diagnosis not present

## 2021-05-19 ENCOUNTER — Ambulatory Visit: Payer: Medicaid Other | Admitting: Podiatry

## 2021-10-05 DIAGNOSIS — F4322 Adjustment disorder with anxiety: Secondary | ICD-10-CM | POA: Diagnosis not present

## 2021-10-20 DIAGNOSIS — F4322 Adjustment disorder with anxiety: Secondary | ICD-10-CM | POA: Diagnosis not present

## 2021-11-03 DIAGNOSIS — F4322 Adjustment disorder with anxiety: Secondary | ICD-10-CM | POA: Diagnosis not present

## 2021-12-12 DIAGNOSIS — Z202 Contact with and (suspected) exposure to infections with a predominantly sexual mode of transmission: Secondary | ICD-10-CM | POA: Diagnosis not present

## 2021-12-12 DIAGNOSIS — F419 Anxiety disorder, unspecified: Secondary | ICD-10-CM | POA: Diagnosis not present

## 2021-12-27 DIAGNOSIS — F4322 Adjustment disorder with anxiety: Secondary | ICD-10-CM | POA: Diagnosis not present

## 2022-01-03 DIAGNOSIS — F4322 Adjustment disorder with anxiety: Secondary | ICD-10-CM | POA: Diagnosis not present

## 2022-01-10 DIAGNOSIS — J40 Bronchitis, not specified as acute or chronic: Secondary | ICD-10-CM | POA: Diagnosis not present

## 2022-01-10 DIAGNOSIS — J342 Deviated nasal septum: Secondary | ICD-10-CM | POA: Diagnosis not present

## 2022-01-10 DIAGNOSIS — J329 Chronic sinusitis, unspecified: Secondary | ICD-10-CM | POA: Diagnosis not present

## 2022-01-16 DIAGNOSIS — J342 Deviated nasal septum: Secondary | ICD-10-CM | POA: Insufficient documentation

## 2022-01-16 DIAGNOSIS — J302 Other seasonal allergic rhinitis: Secondary | ICD-10-CM | POA: Insufficient documentation

## 2022-01-16 DIAGNOSIS — J343 Hypertrophy of nasal turbinates: Secondary | ICD-10-CM | POA: Diagnosis not present

## 2022-05-21 DIAGNOSIS — F4321 Adjustment disorder with depressed mood: Secondary | ICD-10-CM | POA: Diagnosis not present

## 2022-05-31 DIAGNOSIS — F4321 Adjustment disorder with depressed mood: Secondary | ICD-10-CM | POA: Diagnosis not present

## 2022-06-13 DIAGNOSIS — F4321 Adjustment disorder with depressed mood: Secondary | ICD-10-CM | POA: Diagnosis not present

## 2022-06-15 DIAGNOSIS — B079 Viral wart, unspecified: Secondary | ICD-10-CM | POA: Diagnosis not present

## 2022-07-12 DIAGNOSIS — F4321 Adjustment disorder with depressed mood: Secondary | ICD-10-CM | POA: Diagnosis not present

## 2022-07-13 DIAGNOSIS — B079 Viral wart, unspecified: Secondary | ICD-10-CM | POA: Diagnosis not present

## 2022-07-30 DIAGNOSIS — F4321 Adjustment disorder with depressed mood: Secondary | ICD-10-CM | POA: Diagnosis not present

## 2022-08-08 DIAGNOSIS — F4321 Adjustment disorder with depressed mood: Secondary | ICD-10-CM | POA: Diagnosis not present

## 2022-08-13 DIAGNOSIS — F4321 Adjustment disorder with depressed mood: Secondary | ICD-10-CM | POA: Diagnosis not present

## 2022-09-05 DIAGNOSIS — F4320 Adjustment disorder, unspecified: Secondary | ICD-10-CM | POA: Diagnosis not present

## 2022-09-13 DIAGNOSIS — F4321 Adjustment disorder with depressed mood: Secondary | ICD-10-CM | POA: Diagnosis not present

## 2022-09-17 DIAGNOSIS — F4320 Adjustment disorder, unspecified: Secondary | ICD-10-CM | POA: Diagnosis not present

## 2022-09-20 DIAGNOSIS — F4321 Adjustment disorder with depressed mood: Secondary | ICD-10-CM | POA: Diagnosis not present

## 2022-09-25 DIAGNOSIS — F4320 Adjustment disorder, unspecified: Secondary | ICD-10-CM | POA: Diagnosis not present

## 2022-09-27 DIAGNOSIS — F4321 Adjustment disorder with depressed mood: Secondary | ICD-10-CM | POA: Diagnosis not present

## 2022-10-01 DIAGNOSIS — F4320 Adjustment disorder, unspecified: Secondary | ICD-10-CM | POA: Diagnosis not present

## 2022-10-02 DIAGNOSIS — F4321 Adjustment disorder with depressed mood: Secondary | ICD-10-CM | POA: Diagnosis not present

## 2022-10-09 DIAGNOSIS — F4321 Adjustment disorder with depressed mood: Secondary | ICD-10-CM | POA: Diagnosis not present

## 2022-10-09 DIAGNOSIS — F4320 Adjustment disorder, unspecified: Secondary | ICD-10-CM | POA: Diagnosis not present

## 2022-10-15 DIAGNOSIS — F4320 Adjustment disorder, unspecified: Secondary | ICD-10-CM | POA: Diagnosis not present

## 2022-10-18 DIAGNOSIS — F4321 Adjustment disorder with depressed mood: Secondary | ICD-10-CM | POA: Diagnosis not present

## 2022-10-22 DIAGNOSIS — F4321 Adjustment disorder with depressed mood: Secondary | ICD-10-CM | POA: Diagnosis not present

## 2022-10-23 DIAGNOSIS — F4320 Adjustment disorder, unspecified: Secondary | ICD-10-CM | POA: Diagnosis not present

## 2022-10-29 DIAGNOSIS — F4320 Adjustment disorder, unspecified: Secondary | ICD-10-CM | POA: Diagnosis not present

## 2022-11-01 DIAGNOSIS — F4320 Adjustment disorder, unspecified: Secondary | ICD-10-CM | POA: Diagnosis not present

## 2022-11-01 DIAGNOSIS — F4321 Adjustment disorder with depressed mood: Secondary | ICD-10-CM | POA: Diagnosis not present

## 2022-11-07 DIAGNOSIS — F4320 Adjustment disorder, unspecified: Secondary | ICD-10-CM | POA: Diagnosis not present

## 2022-11-08 DIAGNOSIS — F4321 Adjustment disorder with depressed mood: Secondary | ICD-10-CM | POA: Diagnosis not present

## 2022-11-12 DIAGNOSIS — F4320 Adjustment disorder, unspecified: Secondary | ICD-10-CM | POA: Diagnosis not present

## 2022-11-15 DIAGNOSIS — F4321 Adjustment disorder with depressed mood: Secondary | ICD-10-CM | POA: Diagnosis not present

## 2022-11-19 DIAGNOSIS — F4321 Adjustment disorder with depressed mood: Secondary | ICD-10-CM | POA: Diagnosis not present

## 2022-11-20 DIAGNOSIS — F4320 Adjustment disorder, unspecified: Secondary | ICD-10-CM | POA: Diagnosis not present

## 2022-11-21 DIAGNOSIS — F4320 Adjustment disorder, unspecified: Secondary | ICD-10-CM | POA: Diagnosis not present

## 2022-12-04 DIAGNOSIS — F4320 Adjustment disorder, unspecified: Secondary | ICD-10-CM | POA: Diagnosis not present

## 2022-12-05 DIAGNOSIS — F4320 Adjustment disorder, unspecified: Secondary | ICD-10-CM | POA: Diagnosis not present

## 2022-12-07 DIAGNOSIS — Z01411 Encounter for gynecological examination (general) (routine) with abnormal findings: Secondary | ICD-10-CM | POA: Diagnosis not present

## 2022-12-07 DIAGNOSIS — Z124 Encounter for screening for malignant neoplasm of cervix: Secondary | ICD-10-CM | POA: Diagnosis not present

## 2022-12-07 DIAGNOSIS — R6889 Other general symptoms and signs: Secondary | ICD-10-CM | POA: Diagnosis not present

## 2022-12-07 DIAGNOSIS — Z6823 Body mass index (BMI) 23.0-23.9, adult: Secondary | ICD-10-CM | POA: Diagnosis not present

## 2022-12-10 DIAGNOSIS — F4320 Adjustment disorder, unspecified: Secondary | ICD-10-CM | POA: Diagnosis not present

## 2022-12-12 DIAGNOSIS — F4320 Adjustment disorder, unspecified: Secondary | ICD-10-CM | POA: Diagnosis not present

## 2022-12-13 DIAGNOSIS — F4321 Adjustment disorder with depressed mood: Secondary | ICD-10-CM | POA: Diagnosis not present

## 2022-12-18 DIAGNOSIS — F4321 Adjustment disorder with depressed mood: Secondary | ICD-10-CM | POA: Diagnosis not present

## 2022-12-18 DIAGNOSIS — F4320 Adjustment disorder, unspecified: Secondary | ICD-10-CM | POA: Diagnosis not present

## 2022-12-20 DIAGNOSIS — F4320 Adjustment disorder, unspecified: Secondary | ICD-10-CM | POA: Diagnosis not present

## 2022-12-24 DIAGNOSIS — F4320 Adjustment disorder, unspecified: Secondary | ICD-10-CM | POA: Diagnosis not present

## 2022-12-27 DIAGNOSIS — F4320 Adjustment disorder, unspecified: Secondary | ICD-10-CM | POA: Diagnosis not present

## 2023-01-10 DIAGNOSIS — F4320 Adjustment disorder, unspecified: Secondary | ICD-10-CM | POA: Diagnosis not present

## 2023-01-16 DIAGNOSIS — F4320 Adjustment disorder, unspecified: Secondary | ICD-10-CM | POA: Diagnosis not present

## 2023-01-21 DIAGNOSIS — F4320 Adjustment disorder, unspecified: Secondary | ICD-10-CM | POA: Diagnosis not present

## 2023-01-22 DIAGNOSIS — F4320 Adjustment disorder, unspecified: Secondary | ICD-10-CM | POA: Diagnosis not present

## 2023-01-24 DIAGNOSIS — F4321 Adjustment disorder with depressed mood: Secondary | ICD-10-CM | POA: Diagnosis not present

## 2023-01-30 DIAGNOSIS — F4320 Adjustment disorder, unspecified: Secondary | ICD-10-CM | POA: Diagnosis not present

## 2023-01-31 DIAGNOSIS — F4320 Adjustment disorder, unspecified: Secondary | ICD-10-CM | POA: Diagnosis not present

## 2023-02-05 DIAGNOSIS — F4321 Adjustment disorder with depressed mood: Secondary | ICD-10-CM | POA: Diagnosis not present

## 2023-02-08 DIAGNOSIS — F4321 Adjustment disorder with depressed mood: Secondary | ICD-10-CM | POA: Diagnosis not present

## 2023-02-27 DIAGNOSIS — F4321 Adjustment disorder with depressed mood: Secondary | ICD-10-CM | POA: Diagnosis not present

## 2023-03-07 DIAGNOSIS — F4321 Adjustment disorder with depressed mood: Secondary | ICD-10-CM | POA: Diagnosis not present

## 2023-03-12 DIAGNOSIS — F4321 Adjustment disorder with depressed mood: Secondary | ICD-10-CM | POA: Diagnosis not present

## 2023-03-23 DIAGNOSIS — F4321 Adjustment disorder with depressed mood: Secondary | ICD-10-CM | POA: Diagnosis not present

## 2023-03-26 DIAGNOSIS — F4321 Adjustment disorder with depressed mood: Secondary | ICD-10-CM | POA: Diagnosis not present

## 2023-04-01 DIAGNOSIS — F4321 Adjustment disorder with depressed mood: Secondary | ICD-10-CM | POA: Diagnosis not present

## 2023-04-02 DIAGNOSIS — F4321 Adjustment disorder with depressed mood: Secondary | ICD-10-CM | POA: Diagnosis not present

## 2023-04-09 DIAGNOSIS — F4321 Adjustment disorder with depressed mood: Secondary | ICD-10-CM | POA: Diagnosis not present

## 2023-04-09 DIAGNOSIS — F411 Generalized anxiety disorder: Secondary | ICD-10-CM | POA: Diagnosis not present

## 2023-04-16 DIAGNOSIS — F4321 Adjustment disorder with depressed mood: Secondary | ICD-10-CM | POA: Diagnosis not present

## 2023-04-23 DIAGNOSIS — F4321 Adjustment disorder with depressed mood: Secondary | ICD-10-CM | POA: Diagnosis not present

## 2023-04-30 DIAGNOSIS — F4321 Adjustment disorder with depressed mood: Secondary | ICD-10-CM | POA: Diagnosis not present

## 2023-05-07 DIAGNOSIS — F4321 Adjustment disorder with depressed mood: Secondary | ICD-10-CM | POA: Diagnosis not present

## 2023-05-08 DIAGNOSIS — F4320 Adjustment disorder, unspecified: Secondary | ICD-10-CM | POA: Diagnosis not present

## 2023-05-18 DIAGNOSIS — F4321 Adjustment disorder with depressed mood: Secondary | ICD-10-CM | POA: Diagnosis not present

## 2023-05-20 DIAGNOSIS — F411 Generalized anxiety disorder: Secondary | ICD-10-CM | POA: Diagnosis not present

## 2023-05-21 DIAGNOSIS — F4321 Adjustment disorder with depressed mood: Secondary | ICD-10-CM | POA: Diagnosis not present

## 2023-05-22 DIAGNOSIS — F411 Generalized anxiety disorder: Secondary | ICD-10-CM | POA: Diagnosis not present

## 2023-05-30 DIAGNOSIS — F4321 Adjustment disorder with depressed mood: Secondary | ICD-10-CM | POA: Diagnosis not present

## 2023-06-04 DIAGNOSIS — F4321 Adjustment disorder with depressed mood: Secondary | ICD-10-CM | POA: Diagnosis not present

## 2023-06-20 DIAGNOSIS — F4321 Adjustment disorder with depressed mood: Secondary | ICD-10-CM | POA: Diagnosis not present

## 2023-07-03 DIAGNOSIS — F411 Generalized anxiety disorder: Secondary | ICD-10-CM | POA: Diagnosis not present

## 2023-07-04 DIAGNOSIS — F4320 Adjustment disorder, unspecified: Secondary | ICD-10-CM | POA: Diagnosis not present

## 2023-07-04 DIAGNOSIS — F4321 Adjustment disorder with depressed mood: Secondary | ICD-10-CM | POA: Diagnosis not present

## 2023-07-15 DIAGNOSIS — F4321 Adjustment disorder with depressed mood: Secondary | ICD-10-CM | POA: Diagnosis not present

## 2023-07-22 DIAGNOSIS — F4321 Adjustment disorder with depressed mood: Secondary | ICD-10-CM | POA: Diagnosis not present

## 2023-07-30 DIAGNOSIS — F4321 Adjustment disorder with depressed mood: Secondary | ICD-10-CM | POA: Diagnosis not present

## 2023-08-07 DIAGNOSIS — F4320 Adjustment disorder, unspecified: Secondary | ICD-10-CM | POA: Diagnosis not present

## 2023-08-13 DIAGNOSIS — F4321 Adjustment disorder with depressed mood: Secondary | ICD-10-CM | POA: Diagnosis not present

## 2023-08-20 DIAGNOSIS — F4321 Adjustment disorder with depressed mood: Secondary | ICD-10-CM | POA: Diagnosis not present

## 2023-08-28 DIAGNOSIS — F4321 Adjustment disorder with depressed mood: Secondary | ICD-10-CM | POA: Diagnosis not present

## 2023-09-03 DIAGNOSIS — F4321 Adjustment disorder with depressed mood: Secondary | ICD-10-CM | POA: Diagnosis not present

## 2023-09-05 DIAGNOSIS — F4321 Adjustment disorder with depressed mood: Secondary | ICD-10-CM | POA: Diagnosis not present

## 2023-09-09 DIAGNOSIS — M545 Low back pain, unspecified: Secondary | ICD-10-CM | POA: Diagnosis not present

## 2023-09-09 DIAGNOSIS — Z23 Encounter for immunization: Secondary | ICD-10-CM | POA: Diagnosis not present

## 2023-09-10 DIAGNOSIS — F4321 Adjustment disorder with depressed mood: Secondary | ICD-10-CM | POA: Diagnosis not present

## 2023-09-17 DIAGNOSIS — M25659 Stiffness of unspecified hip, not elsewhere classified: Secondary | ICD-10-CM | POA: Diagnosis not present

## 2023-09-17 DIAGNOSIS — M9904 Segmental and somatic dysfunction of sacral region: Secondary | ICD-10-CM | POA: Diagnosis not present

## 2023-09-17 DIAGNOSIS — F4321 Adjustment disorder with depressed mood: Secondary | ICD-10-CM | POA: Diagnosis not present

## 2023-09-17 DIAGNOSIS — M9903 Segmental and somatic dysfunction of lumbar region: Secondary | ICD-10-CM | POA: Diagnosis not present

## 2023-09-17 DIAGNOSIS — M9905 Segmental and somatic dysfunction of pelvic region: Secondary | ICD-10-CM | POA: Diagnosis not present

## 2023-09-17 DIAGNOSIS — M7918 Myalgia, other site: Secondary | ICD-10-CM | POA: Diagnosis not present

## 2023-09-20 DIAGNOSIS — F4321 Adjustment disorder with depressed mood: Secondary | ICD-10-CM | POA: Diagnosis not present

## 2023-09-24 DIAGNOSIS — F4321 Adjustment disorder with depressed mood: Secondary | ICD-10-CM | POA: Diagnosis not present

## 2023-09-26 ENCOUNTER — Ambulatory Visit: Payer: BLUE CROSS/BLUE SHIELD | Admitting: Family Medicine

## 2023-10-01 DIAGNOSIS — F4321 Adjustment disorder with depressed mood: Secondary | ICD-10-CM | POA: Diagnosis not present

## 2023-10-07 ENCOUNTER — Ambulatory Visit: Payer: BC Managed Care – PPO | Admitting: Family Medicine

## 2023-10-07 ENCOUNTER — Encounter: Payer: Self-pay | Admitting: Family Medicine

## 2023-10-07 VITALS — BP 100/60 | HR 74 | Ht 66.0 in | Wt 139.0 lb

## 2023-10-07 DIAGNOSIS — D649 Anemia, unspecified: Secondary | ICD-10-CM | POA: Insufficient documentation

## 2023-10-07 DIAGNOSIS — M546 Pain in thoracic spine: Secondary | ICD-10-CM | POA: Diagnosis not present

## 2023-10-07 DIAGNOSIS — M545 Low back pain, unspecified: Secondary | ICD-10-CM | POA: Insufficient documentation

## 2023-10-07 MED ORDER — TIZANIDINE HCL 2 MG PO TABS: 2.0000 mg | ORAL_TABLET | Freq: Three times a day (TID) | ORAL | 1 refills | Status: DC | PRN

## 2023-10-07 MED ORDER — AMBULATORY NON FORMULARY MEDICATION: 0 refills | Status: DC

## 2023-10-07 NOTE — Progress Notes (Signed)
   I, [name], CMA acting as a scribe for [name], MD.  Michele Gray is a 37 y.o. female who presents to [facility] today for LBP x 2-3 weeks. Pt locates pain to midline lower back. Sx tend to flare up with higher stress levels. No known autoimmune disorder. Notes a couple of weeks ago, also having sx in the mid and upper back. Some pain radiating into the gluteal region, hips, and groin at times. Denies radiating pain into the legs. Denies n/t/w in B LE. Has tried Meloxicam with some relief.   Radiating pain: yes LE numbness/tingling: no LE weakness: no Aggravates: stress Treatments tried: Yoga, stretching, heat, Meloxicam  Pertinent review of systems: No fevers or chills  Relevant historical information: Going through a fair amount of stress.  She is divorced and has joint custody with her ex-husband.  Her kids aged 9,10 and 37 are dealing with this tough situation too.. She is working full-time and completing her masters degree.  She recently did midterms and spent a lot of time on the computer.  She uses a laptop primarily.  Exam:  BP 100/60   Pulse 74   Ht 5\' 6"  (1.676 m)   Wt 139 lb (63 kg)   SpO2 99%   BMI 22.44 kg/m  General: Well Developed, well nourished, and in no acute distress.   MSK: C-spine normal appearing normal cervical motion. T-spine nontender midline.  Tender palpation right rhomboid.  Normal thoracic motion. L-spine nontender midline.  Tender palpation right lumbar's paraspinal musculature normal lumbar motion.  Upper and lower extremity strength is intact.      Assessment and Plan: 38 y.o. female with acute upper and right low back pain ongoing for about 3 weeks.  This occurred without injury and is thought to be mostly muscle spasm and dysfunction.  She has tried some meloxicam which helps some.  Recommend heating pad TENS unit meloxicam or ibuprofen in conjunction with Tylenol.  Refer to physical therapy and  encourage improved ergonomics.  Long time on a laptop can exacerbate neck and back pain.  Also prescribed tizanidine.  Recheck in 6 weeks.  She is juggling a lot of challenges right now working full-time she has joint custody with her kids and going to graduate school.  Despite all these challenges she seems to be doing okay but will keep a close eye on it.   PDMP not reviewed this encounter. Orders Placed This Encounter  Procedures   Ambulatory referral to Physical Therapy    Referral Priority:   Routine    Referral Type:   Physical Medicine    Referral Reason:   Specialty Services Required    Requested Specialty:   Physical Therapy    Number of Visits Requested:   1   Meds ordered this encounter  Medications   tiZANidine (ZANAFLEX) 2 MG tablet    Sig: Take 1-2 tablets (2-4 mg total) by mouth every 8 (eight) hours as needed.    Dispense:  60 tablet    Refill:  1   AMBULATORY NON FORMULARY MEDICATION    Sig: Rx for massage therapy as needed    Dispense:  1 each    Refill:  0     Discussed warning signs or symptoms. Please see discharge instructions. Patient expresses understanding.   The above documentation has been reviewed and is accurate and complete [name], M.D.

## 2023-10-07 NOTE — Patient Instructions (Signed)
Thank you for coming in today.   I've referred you to Physical Therapy.  Let us know if you don't hear from them in one week.   Heating pad and TENS unit could help.   Ok to use meloxicam or ibuprofen with tylenol.   Recheck in 6 weeks.   Work on Financial controller.   Let me know if this is not working.

## 2023-10-08 DIAGNOSIS — F4321 Adjustment disorder with depressed mood: Secondary | ICD-10-CM | POA: Diagnosis not present

## 2023-10-15 DIAGNOSIS — F4321 Adjustment disorder with depressed mood: Secondary | ICD-10-CM | POA: Diagnosis not present

## 2023-10-16 ENCOUNTER — Encounter: Payer: Self-pay | Admitting: Physical Therapy

## 2023-10-16 ENCOUNTER — Other Ambulatory Visit: Payer: Self-pay

## 2023-10-16 ENCOUNTER — Ambulatory Visit: Payer: BC Managed Care – PPO | Attending: Family Medicine | Admitting: Physical Therapy

## 2023-10-16 DIAGNOSIS — M545 Low back pain, unspecified: Secondary | ICD-10-CM | POA: Diagnosis not present

## 2023-10-16 DIAGNOSIS — M5459 Other low back pain: Secondary | ICD-10-CM | POA: Insufficient documentation

## 2023-10-16 DIAGNOSIS — R29898 Other symptoms and signs involving the musculoskeletal system: Secondary | ICD-10-CM | POA: Diagnosis not present

## 2023-10-16 DIAGNOSIS — M6281 Muscle weakness (generalized): Secondary | ICD-10-CM | POA: Diagnosis not present

## 2023-10-16 DIAGNOSIS — M546 Pain in thoracic spine: Secondary | ICD-10-CM | POA: Insufficient documentation
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
## 2023-10-22 DIAGNOSIS — F4321 Adjustment disorder with depressed mood: Secondary | ICD-10-CM | POA: Diagnosis not present

## 2023-11-05 DIAGNOSIS — F4321 Adjustment disorder with depressed mood: Secondary | ICD-10-CM | POA: Diagnosis not present

## 2023-11-08 ENCOUNTER — Ambulatory Visit: Payer: BC Managed Care – PPO | Attending: Family Medicine

## 2023-11-08 DIAGNOSIS — M5459 Other low back pain: Secondary | ICD-10-CM | POA: Diagnosis not present

## 2023-11-08 DIAGNOSIS — M6281 Muscle weakness (generalized): Secondary | ICD-10-CM | POA: Diagnosis not present

## 2023-11-08 DIAGNOSIS — R29898 Other symptoms and signs involving the musculoskeletal system: Secondary | ICD-10-CM | POA: Insufficient documentation
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
## 2023-11-08 NOTE — Patient Instructions (Signed)
Ball under pelvis for pelvic clocking: anterior/posterior (forward/backward), lateral or side to side like a see saw with pelvis moving but not legs, then circles B with upper back and front ribs staying down/closed - One leg into table top over hip and pressing knee down to keep back of hip heavy on ball, use exhale to barely hover the other foot using your pelvic floor and transverse abdominis/corset muscle x8 B - Both legs in table top, slowly low one down partially keeping back flat and knee bent so movement comes from hip x6-8 B - Keep table top and zip legs together, slowly lower both very small range of motion and drag them back to table top (inhale to lower, exhale to lift) - remove ball and notice how much better your low back feels!

## 2023-11-12 DIAGNOSIS — F4321 Adjustment disorder with depressed mood: Secondary | ICD-10-CM | POA: Diagnosis not present

## 2023-11-13 ENCOUNTER — Ambulatory Visit: Payer: BC Managed Care – PPO | Admitting: Physical Therapy

## 2023-11-15 ENCOUNTER — Ambulatory Visit: Payer: BC Managed Care – PPO | Admitting: Physical Therapy

## 2023-11-15 ENCOUNTER — Encounter: Payer: Self-pay | Admitting: Physical Therapy

## 2023-11-15 DIAGNOSIS — M6281 Muscle weakness (generalized): Secondary | ICD-10-CM

## 2023-11-15 DIAGNOSIS — R29898 Other symptoms and signs involving the musculoskeletal system: Secondary | ICD-10-CM

## 2023-11-15 DIAGNOSIS — M5459 Other low back pain: Secondary | ICD-10-CM
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
## 2023-11-18 ENCOUNTER — Ambulatory Visit: Payer: BLUE CROSS/BLUE SHIELD | Admitting: Family Medicine

## 2023-11-18 ENCOUNTER — Ambulatory Visit: Payer: BC Managed Care – PPO | Admitting: Physical Therapy

## 2023-11-19 DIAGNOSIS — F4321 Adjustment disorder with depressed mood: Secondary | ICD-10-CM | POA: Diagnosis not present

## 2023-11-22 ENCOUNTER — Encounter: Payer: Self-pay | Admitting: Physical Therapy

## 2023-11-22 ENCOUNTER — Ambulatory Visit: Payer: BC Managed Care – PPO | Admitting: Physical Therapy

## 2023-11-22 DIAGNOSIS — M5459 Other low back pain: Secondary | ICD-10-CM | POA: Diagnosis not present

## 2023-11-22 DIAGNOSIS — M6281 Muscle weakness (generalized): Secondary | ICD-10-CM | POA: Diagnosis not present

## 2023-11-22 DIAGNOSIS — R29898 Other symptoms and signs involving the musculoskeletal system: Secondary | ICD-10-CM
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
## 2023-11-26 DIAGNOSIS — F4321 Adjustment disorder with depressed mood: Secondary | ICD-10-CM | POA: Diagnosis not present

## 2023-12-06 ENCOUNTER — Encounter: Payer: BC Managed Care – PPO | Admitting: Physical Therapy

## 2023-12-10 DIAGNOSIS — F4321 Adjustment disorder with depressed mood: Secondary | ICD-10-CM | POA: Diagnosis not present

## 2023-12-13 ENCOUNTER — Ambulatory Visit: Payer: BC Managed Care – PPO

## 2023-12-17 DIAGNOSIS — F4321 Adjustment disorder with depressed mood: Secondary | ICD-10-CM | POA: Diagnosis not present

## 2023-12-20 ENCOUNTER — Ambulatory Visit: Payer: BC Managed Care – PPO | Attending: Family Medicine

## 2023-12-20 DIAGNOSIS — M6281 Muscle weakness (generalized): Secondary | ICD-10-CM | POA: Diagnosis not present

## 2023-12-20 DIAGNOSIS — R29898 Other symptoms and signs involving the musculoskeletal system: Secondary | ICD-10-CM | POA: Diagnosis not present

## 2023-12-20 DIAGNOSIS — M5459 Other low back pain: Secondary | ICD-10-CM | POA: Diagnosis not present
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
## 2023-12-24 DIAGNOSIS — F4321 Adjustment disorder with depressed mood: Secondary | ICD-10-CM | POA: Diagnosis not present

## 2023-12-25 DIAGNOSIS — Z01419 Encounter for gynecological examination (general) (routine) without abnormal findings: Secondary | ICD-10-CM | POA: Diagnosis not present

## 2023-12-27 ENCOUNTER — Ambulatory Visit: Payer: BC Managed Care – PPO

## 2023-12-27 DIAGNOSIS — M5459 Other low back pain: Secondary | ICD-10-CM

## 2023-12-27 DIAGNOSIS — R29898 Other symptoms and signs involving the musculoskeletal system: Secondary | ICD-10-CM

## 2023-12-27 DIAGNOSIS — M6281 Muscle weakness (generalized): Secondary | ICD-10-CM
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
## 2023-12-31 DIAGNOSIS — F4321 Adjustment disorder with depressed mood: Secondary | ICD-10-CM | POA: Diagnosis not present

## 2024-01-07 DIAGNOSIS — F4321 Adjustment disorder with depressed mood: Secondary | ICD-10-CM | POA: Diagnosis not present

## 2024-01-16 DIAGNOSIS — F4321 Adjustment disorder with depressed mood: Secondary | ICD-10-CM | POA: Diagnosis not present

## 2024-01-21 DIAGNOSIS — F4321 Adjustment disorder with depressed mood: Secondary | ICD-10-CM | POA: Diagnosis not present

## 2024-01-28 DIAGNOSIS — F4321 Adjustment disorder with depressed mood: Secondary | ICD-10-CM | POA: Diagnosis not present

## 2024-02-05 DIAGNOSIS — Z30433 Encounter for removal and reinsertion of intrauterine contraceptive device: Secondary | ICD-10-CM | POA: Diagnosis not present

## 2024-02-18 DIAGNOSIS — F4321 Adjustment disorder with depressed mood: Secondary | ICD-10-CM | POA: Diagnosis not present

## 2024-03-04 DIAGNOSIS — F4321 Adjustment disorder with depressed mood: Secondary | ICD-10-CM | POA: Diagnosis not present

## 2024-03-06 DIAGNOSIS — R209 Unspecified disturbances of skin sensation: Secondary | ICD-10-CM | POA: Diagnosis not present

## 2024-03-06 DIAGNOSIS — S61411A Laceration without foreign body of right hand, initial encounter: Secondary | ICD-10-CM | POA: Diagnosis not present

## 2024-03-10 DIAGNOSIS — F4321 Adjustment disorder with depressed mood: Secondary | ICD-10-CM | POA: Diagnosis not present

## 2024-03-13 DIAGNOSIS — M79644 Pain in right finger(s): Secondary | ICD-10-CM | POA: Diagnosis not present

## 2024-03-13 DIAGNOSIS — M795 Residual foreign body in soft tissue: Secondary | ICD-10-CM | POA: Diagnosis not present
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
## 2024-03-19 DIAGNOSIS — B029 Zoster without complications: Secondary | ICD-10-CM | POA: Diagnosis not present

## 2024-03-23 DIAGNOSIS — F4321 Adjustment disorder with depressed mood: Secondary | ICD-10-CM | POA: Diagnosis not present

## 2024-04-07 DIAGNOSIS — F4321 Adjustment disorder with depressed mood: Secondary | ICD-10-CM | POA: Diagnosis not present

## 2024-04-14 DIAGNOSIS — F4321 Adjustment disorder with depressed mood: Secondary | ICD-10-CM | POA: Diagnosis not present

## 2024-04-21 DIAGNOSIS — F4321 Adjustment disorder with depressed mood: Secondary | ICD-10-CM | POA: Diagnosis not present

## 2024-04-28 DIAGNOSIS — F4321 Adjustment disorder with depressed mood: Secondary | ICD-10-CM | POA: Diagnosis not present

## 2024-05-05 DIAGNOSIS — F4321 Adjustment disorder with depressed mood: Secondary | ICD-10-CM | POA: Diagnosis not present

## 2024-05-12 DIAGNOSIS — F4321 Adjustment disorder with depressed mood: Secondary | ICD-10-CM | POA: Diagnosis not present

## 2024-05-19 DIAGNOSIS — F4321 Adjustment disorder with depressed mood: Secondary | ICD-10-CM | POA: Diagnosis not present

## 2024-05-25 DIAGNOSIS — F4320 Adjustment disorder, unspecified: Secondary | ICD-10-CM | POA: Diagnosis not present

## 2024-05-26 DIAGNOSIS — F4321 Adjustment disorder with depressed mood: Secondary | ICD-10-CM | POA: Diagnosis not present

## 2024-06-02 DIAGNOSIS — F4321 Adjustment disorder with depressed mood: Secondary | ICD-10-CM | POA: Diagnosis not present

## 2024-06-09 DIAGNOSIS — F4321 Adjustment disorder with depressed mood: Secondary | ICD-10-CM | POA: Diagnosis not present

## 2024-06-29 DIAGNOSIS — Z30431 Encounter for routine checking of intrauterine contraceptive device: Secondary | ICD-10-CM | POA: Diagnosis not present

## 2024-06-30 DIAGNOSIS — F4321 Adjustment disorder with depressed mood: Secondary | ICD-10-CM | POA: Diagnosis not present

## 2024-07-06 DIAGNOSIS — Z1329 Encounter for screening for other suspected endocrine disorder: Secondary | ICD-10-CM | POA: Diagnosis not present

## 2024-07-06 DIAGNOSIS — Z130 Encounter for screening for diseases of the blood and blood-forming organs and certain disorders involving the immune mechanism: Secondary | ICD-10-CM | POA: Diagnosis not present

## 2024-07-06 DIAGNOSIS — Z0000 Encounter for general adult medical examination without abnormal findings: Secondary | ICD-10-CM | POA: Diagnosis not present

## 2024-07-07 DIAGNOSIS — R7303 Prediabetes: Secondary | ICD-10-CM | POA: Diagnosis not present

## 2024-07-07 DIAGNOSIS — Z0000 Encounter for general adult medical examination without abnormal findings: Secondary | ICD-10-CM | POA: Diagnosis not present

## 2024-07-14 DIAGNOSIS — F4321 Adjustment disorder with depressed mood: Secondary | ICD-10-CM | POA: Diagnosis not present

## 2024-07-28 DIAGNOSIS — F4321 Adjustment disorder with depressed mood: Secondary | ICD-10-CM | POA: Diagnosis not present

## 2024-08-18 DIAGNOSIS — F4321 Adjustment disorder with depressed mood: Secondary | ICD-10-CM | POA: Diagnosis not present

## 2024-09-01 DIAGNOSIS — F4321 Adjustment disorder with depressed mood: Secondary | ICD-10-CM | POA: Diagnosis not present

## 2024-09-08 DIAGNOSIS — Z113 Encounter for screening for infections with a predominantly sexual mode of transmission: Secondary | ICD-10-CM | POA: Diagnosis not present

## 2024-09-08 DIAGNOSIS — F4321 Adjustment disorder with depressed mood: Secondary | ICD-10-CM | POA: Diagnosis not present

## 2024-09-15 DIAGNOSIS — F4321 Adjustment disorder with depressed mood: Secondary | ICD-10-CM | POA: Diagnosis not present

## 2024-09-18 DIAGNOSIS — J301 Allergic rhinitis due to pollen: Secondary | ICD-10-CM | POA: Diagnosis not present

## 2024-09-18 DIAGNOSIS — H1045 Other chronic allergic conjunctivitis: Secondary | ICD-10-CM | POA: Diagnosis not present

## 2024-09-21 DIAGNOSIS — F4321 Adjustment disorder with depressed mood: Secondary | ICD-10-CM | POA: Diagnosis not present

## 2024-10-02 ENCOUNTER — Other Ambulatory Visit: Payer: Self-pay

## 2024-10-02 ENCOUNTER — Encounter: Payer: Self-pay | Admitting: Internal Medicine

## 2024-10-02 ENCOUNTER — Ambulatory Visit: Admitting: Internal Medicine

## 2024-10-02 VITALS — BP 98/56 | HR 78 | Temp 98.5°F | Ht 66.0 in | Wt 139.4 lb

## 2024-10-02 DIAGNOSIS — J3089 Other allergic rhinitis: Secondary | ICD-10-CM

## 2024-10-02 NOTE — Progress Notes (Signed)
 NEW PATIENT  Date of Service/Encounter:  10/02/24  Consult requested by: Cristopher Bottcher, NP   Subjective:   Michele Gray (DOB: Feb 17, 1986) is a 38 y.o. female who presents to the clinic on 10/02/2024 with a chief complaint of Allergic Rhinitis  and Establish Care .    History obtained from: chart review and patient.   Rhinitis:  Started since she was very little.  Symptoms include: nasal congestion, rhinorrhea, post nasal drainage, and sneezing  Occurs year-round with seasonal flares with Spring/Fall  Potential triggers: none  Treatments tried:  Azelastine Xyzal Olopatadine eye drops   Previous allergy testing: yes 09/18/2024  History of sinus surgery: no Nonallergic triggers: none   Reviewed:  Records from [facility] 09/2204 with positive SPT to almost all aeroallergens.  Discussed allergy shots and use of Flonase, Azelastine, Xyzal, Olopatadine.    07/07/2024: seen by Margarete for preDM and annual, no cardiac/pulm PMH.  Past Medical History: Past Medical History:  Diagnosis Date   Anemia    Past Surgical History: Past Surgical History:  Procedure Laterality Date   NO PAST SURGERIES      Family History: Family History  Problem Relation Age of Onset   Eczema Father    Eczema Sister     Social History:  Flooring in bedroom: carpet Pets: none  Tobacco use/exposure: none  Job: data analysis   Medication List:  Allergies as of 10/02/2024       Reactions   Amoxapine And Related Itching, Rash   Flagyl [metronidazole] Rash        Medication List        Accurate as of October 02, 2024  3:21 PM. If you have any questions, ask your nurse or doctor.          AMBULATORY NON FORMULARY MEDICATION Rx for massage therapy as needed   fluticasone 50 MCG/ACT nasal spray Commonly known as: FLONASE Place 2 sprays into both nostrils daily as needed for allergies or rhinitis.   meloxicam 15 MG tablet Commonly known as: MOBIC Take 15 mg by mouth  daily.   Mirena  (52 MG) 20 MCG/DAY Iud Generic drug: levonorgestrel  Take 1 device by intrauterine route.   multivitamin-prenatal 27-0.8 MG Tabs tablet Take 1 tablet by mouth at bedtime.   ranitidine 150 MG tablet Commonly known as: ZANTAC Take 150 mg by mouth at bedtime.   tiZANidine  2 MG tablet Commonly known as: ZANAFLEX  Take 1-2 tablets (2-4 mg total) by mouth every 8 (eight) hours as needed.         REVIEW OF SYSTEMS: Pertinent positives and negatives discussed in HPI.   Objective:   Physical Exam: BP (!) 98/56 (BP Location: Left Arm, Patient Position: Sitting, Cuff Size: Normal)   Pulse 78   Temp 98.5 F (36.9 C) (Temporal)   Ht 5' 6 (1.676 m)   Wt 139 lb 6.4 oz (63.2 kg)   SpO2 97%   BMI 22.50 kg/m  Body mass index is 22.5 kg/m. GEN: alert, well developed HEENT: clear conjunctiva, nose with + mild inferior turbinate hypertrophy, pink nasal mucosa, slight clear rhinorrhea, + cobblestoning HEART: regular rate and rhythm, no murmur LUNGS: clear to auscultation bilaterally, no coughing, unlabored respiration ABDOMEN: soft, non distended  SKIN: no rashes or lesions    Assessment:   1. Other allergic rhinitis     Plan/Recommendations:  Other Allergic Rhinitis: - Due to turbinate hypertrophy, seasonal symptoms and unresponsive to over the counter meds, will perform blood testing to evaluate for  aeroallergens.   - Use nasal saline rinses before nose sprays such as with Neilmed Sinus Rinse.  Use distilled water.   - Use Azelastine 2 sprays each nostril twice daily as needed for runny nose, drainage, sneezing, congestion. Aim upward and outward. - Use Xyzal 5 mg daily.  - For eyes, use Olopatadine 0.2% 1 eye drop daily as needed for itchy, watery eyes.  Available over the counter, if not covered by insurance.  - Consider allergy shots as long term control of your symptoms by teaching your immune system to be more tolerant of your allergy triggers. Given  information on 2 shots, 2 vials, traditional.  If interested in starting, please call us  back after discussing with insurance company.       Return in about 3 months (around 01/02/2025).  Arleta Blanch, MD [facility] 

## 2024-10-02 NOTE — Patient Instructions (Addendum)
 Other Allergic Rhinitis:   - Use nasal saline rinses before nose sprays such as with Neilmed Sinus Rinse.  Use distilled water.   - Use Azelastine 2 sprays each nostril twice daily as needed for runny nose, drainage, sneezing, congestion. Aim upward and outward. - Use Xyzal 5 mg daily.  - For eyes, use Olopatadine 0.2% 1 eye drop daily as needed for itchy, watery eyes.  Available over the counter, if not covered by insurance.  - Consider allergy shots as long term control of your symptoms by teaching your immune system to be more tolerant of your allergy triggers

## 2024-10-05 ENCOUNTER — Ambulatory Visit: Payer: Self-pay | Admitting: Internal Medicine

## 2024-10-05 LAB — ALLERGENS W/TOTAL IGE AREA 2
Alternaria Alternata IgE: 1.17 kU/L — AB
Aspergillus Fumigatus IgE: <0.1 kU/L
Bermuda Grass IgE: <0.1 kU/L
Cat Dander IgE: 0.26 kU/L — AB
Cedar, Mountain IgE: <0.1 kU/L
Cladosporium Herbarum IgE: 0.46 kU/L — AB
Cockroach, German IgE: <0.1 kU/L
Common Silver Birch IgE: 0.11 kU/L — AB
Cottonwood IgE: <0.1 kU/L
D Farinae IgE: 0.73 kU/L — AB
D Pteronyssinus IgE: 0.69 kU/L — AB
Dog Dander IgE: 0.63 kU/L — AB
Elm, American IgE: <0.1 kU/L
IgE (Immunoglobulin E), Serum: 73 [IU]/mL (ref 6–495)
Johnson Grass IgE: <0.1 kU/L
Maple/Box Elder IgE: <0.1 kU/L
Mouse Urine IgE: <0.1 kU/L
Oak, White IgE: 0.33 kU/L — AB
Pecan, Hickory IgE: <0.1 kU/L
Penicillium Chrysogen IgE: <0.1 kU/L
Pigweed, Rough IgE: <0.1 kU/L
Ragweed, Short IgE: 0.1 kU/L — AB
Sheep Sorrel IgE Qn: <0.1 kU/L
Timothy Grass IgE: 8.64 kU/L — AB
White Mulberry IgE: <0.1 kU/L

## 2024-10-06 ENCOUNTER — Other Ambulatory Visit: Payer: Self-pay | Admitting: Internal Medicine

## 2024-10-06 DIAGNOSIS — F4321 Adjustment disorder with depressed mood: Secondary | ICD-10-CM | POA: Diagnosis not present

## 2024-10-06 DIAGNOSIS — J302 Other seasonal allergic rhinitis: Secondary | ICD-10-CM

## 2024-10-06 MED ORDER — EPINEPHRINE 0.3 MG/0.3ML IJ SOAJ: 0.3000 mg | INTRAMUSCULAR | 1 refills | Status: AC | PRN

## 2024-10-06 NOTE — Telephone Encounter (Signed)
 Spoke with patient about lab results patient verbalized understanding.  Patient has been schedule for new start AIT on 12/11/24 @ 10 am.  Requisition has been sent to Dr.Patel.

## 2024-10-06 NOTE — Progress Notes (Signed)
 AIT Rx sent. Epipen  sent.

## 2024-10-13 DIAGNOSIS — F4321 Adjustment disorder with depressed mood: Secondary | ICD-10-CM | POA: Diagnosis not present

## 2024-11-06 ENCOUNTER — Other Ambulatory Visit: Payer: Self-pay | Admitting: Internal Medicine

## 2024-11-06 DIAGNOSIS — J302 Other seasonal allergic rhinitis: Secondary | ICD-10-CM

## 2024-11-06 NOTE — Progress Notes (Signed)
AIT Rx signed.

## 2024-11-10 DIAGNOSIS — F4321 Adjustment disorder with depressed mood: Secondary | ICD-10-CM | POA: Diagnosis not present

## 2024-11-16 NOTE — Progress Notes (Signed)
 WILL MAKE CLOSER TO APPT TIME

## 2024-11-16 NOTE — Progress Notes (Signed)
 Aeroallergen Immunotherapy  Ordering Provider: Arleta Blanch, MD  Patient Details Name: MAKEBA DELCASTILLO MRN: [id number] Date of Birth: Dec 04, 1985 Order 1 of 2  Vial Label: TGW, DM, C, D  0.3 ml (Volume)  BAU Concentration -- 7 Grass Mix* 100,000 ([place]  Blue, [place], Orchard, Perennial Rye, RedTop, Sweet Vernal, Timothy) 0.3 ml (Volume)  1:20 Concentration -- Ragweed Mix 0.2 ml (Volume)  1:10 Concentration -- Valrie mix* 0.3 ml (Volume)  1:10 Concentration -- Oak, Eastern mix* 0.5 ml (Volume)  1:10 Concentration -- Cat Hair 0.5 ml (Volume)  1:10 Concentration -- Dog Epithelia 0.5 ml (Volume)   AU Concentration -- Mite Mix (DF 5,000 & DP 5,000)   2.6  ml Extract Subtotal 2.4  ml Diluent  5.0  ml Maintenance Total   Schedule:  C  Silver Vial (1:10,000): Schedule C (5 doses) Green Vial (1:1,000): Schedule C (5 doses) Blue Vial (1:100): Schedule C (5 doses) Yellow Vial (1:10): Schedule C (5 doses) Red Vial (1:1): Schedule A (14 doses)  Special Instructions: After completion of the first Red Vial, please space to every two weeks. After completion of the second Red Vial, please space to every 4 weeks. Ok to up dose new vials on Schedule D. Ok to come twice weekly, if desired, as long as there is 48 hours between injections.

## 2024-11-16 NOTE — Progress Notes (Signed)
 Aeroallergen Immunotherapy  Ordering Provider: Arleta Blanch, MD  Patient Details Name: XEE HOLLMAN MRN: [id number] Date of Birth: 05-Feb-1986 Order 2 of 2  Vial Label: molds  0.2 ml (Volume)  1:20 Concentration -- Alternaria alternata 0.2 ml (Volume)  1:20 Concentration -- Cladosporium herbarum   0.4  ml Extract Subtotal 4.6  ml Diluent  5.0  ml Maintenance Total   Schedule:  C  Silver Vial (1:10,000): Schedule C (5 doses) Green Vial (1:1,000): Schedule C (5 doses) Blue Vial (1:100): Schedule C (5 doses) Yellow Vial (1:10): Schedule C (5 doses) Red Vial (1:1): Schedule A (14 doses)  Special Instructions: After completion of the first Red Vial, please space to every two weeks. After completion of the second Red Vial, please space to every 4 weeks. Ok to up dose new vials on Schedule D. Ok to come twice weekly, if desired, as long as there is 48 hours between injections.

## 2024-11-17 DIAGNOSIS — F4321 Adjustment disorder with depressed mood: Secondary | ICD-10-CM | POA: Diagnosis not present

## 2024-11-19 DIAGNOSIS — J301 Allergic rhinitis due to pollen: Secondary | ICD-10-CM | POA: Diagnosis not present

## 2024-11-19 DIAGNOSIS — J302 Other seasonal allergic rhinitis: Secondary | ICD-10-CM | POA: Diagnosis not present

## 2024-11-19 DIAGNOSIS — J3081 Allergic rhinitis due to animal (cat) (dog) hair and dander: Secondary | ICD-10-CM | POA: Diagnosis not present

## 2024-11-19 DIAGNOSIS — J3089 Other allergic rhinitis: Secondary | ICD-10-CM | POA: Diagnosis not present

## 2024-11-19 NOTE — Progress Notes (Signed)
 VIALS MADE ON 11/20/24

## 2024-12-08 ENCOUNTER — Ambulatory Visit (INDEPENDENT_AMBULATORY_CARE_PROVIDER_SITE_OTHER)

## 2024-12-08 ENCOUNTER — Ambulatory Visit: Admitting: Internal Medicine

## 2024-12-08 ENCOUNTER — Encounter: Payer: Self-pay | Admitting: Internal Medicine

## 2024-12-08 VITALS — BP 102/62 | HR 58 | Ht 66.0 in | Wt 141.0 lb

## 2024-12-08 DIAGNOSIS — J302 Other seasonal allergic rhinitis: Secondary | ICD-10-CM | POA: Diagnosis not present

## 2024-12-08 DIAGNOSIS — F419 Anxiety disorder, unspecified: Secondary | ICD-10-CM | POA: Insufficient documentation

## 2024-12-08 DIAGNOSIS — J3089 Other allergic rhinitis: Secondary | ICD-10-CM

## 2024-12-08 DIAGNOSIS — F321 Major depressive disorder, single episode, moderate: Secondary | ICD-10-CM | POA: Insufficient documentation

## 2024-12-08 DIAGNOSIS — K219 Gastro-esophageal reflux disease without esophagitis: Secondary | ICD-10-CM | POA: Insufficient documentation

## 2024-12-08 NOTE — Patient Instructions (Addendum)
 Allergic Rhinitis:   - sIgE 09/2024: positive to dust mites, cats, dogs, grasses, molds, trees, weeds  - Use nasal saline rinses before nose sprays such as with Neilmed Sinus Rinse.  Use distilled water.   - Use Azelastine 2 sprays each nostril twice daily as needed for runny nose, drainage, sneezing, congestion. Aim upward and outward. - Use Xyzal 5 mg daily.  - For eyes, use Olopatadine 0.2% 1 eye drop daily as needed for itchy, watery eyes.  Available over the counter, if not covered by insurance.  - Continue allergy shots weekly. Bring Epipen  with each visit and wait 30 minutes after shots.

## 2024-12-08 NOTE — Progress Notes (Signed)
"                                                                        Immunotherapy   Patient Details  Name: Michele Gray MRN: [id number] Date of Birth: [birth year]  12/08/2024  [name] started injections for  MOLD, POLLEN-DM-C-D Following schedule: C  Frequency: 1-2X Weekly  Epi-Pen:Epi-Pen Available  Consent signed and patient instructions given.  Patient started Allergy Injections and received .10mL of MOLD in the LUA and .10mL of POLLEN-DM-C-D in the RUA. Patient waited 30 minutes in office and did not experience any issues.  [name] 12/08/2024, 2:09 PM   "

## 2024-12-08 NOTE — Progress Notes (Signed)
" ° °  FOLLOW UP Date of Service/Encounter:  12/08/2024   Subjective:  Michele Gray (DOB: 30-Dec-1985) is a 39 y.o. female who [facility] for follow up for allergic rhinitis.   History obtained from: chart review and patient. Last visit was initial with me on 10/02/2024 and at the time, she had recently seen [facility] for testing few weeks prior with reactivity to all aeroallergens.  Notes chronic symptoms and wished to start AIT with us  so we obtained blood testing to identify specific allergens to target.  In meantime, continue Azelastine/Olopatadine eye drops PRN and Xyzal daily.    Still with frequent congestion, runny nose, sneezing. Takes xyzal daily.  On nose sprays/eye drops PRN. Got her first shot today, no issues.   Past Medical History: Past Medical History:  Diagnosis Date   Anemia     Objective:  BP 102/62   Pulse (!) 58   Ht 5' 6 (1.676 m)   Wt 141 lb (64 kg)   SpO2 100%   Breastfeeding No   BMI 22.76 kg/m  Body mass index is 22.76 kg/m. Physical Exam: GEN: alert, well developed HEENT: clear conjunctiva, nose with mild inferior turbinate hypertrophy, pink nasal mucosa, clear rhinorrhea, + cobblestoning HEART: regular rate and rhythm, no murmur LUNGS: clear to auscultation bilaterally, no coughing, unlabored respiration SKIN: no rashes or lesions Assessment:   1. Seasonal and perennial allergic rhinitis     Plan/Recommendations:  Allergic Rhinitis:   - Uncontrolled, continue AIT.  - sIgE 09/2024: positive to dust mites, cats, dogs, grasses, molds, trees, weeds  - Use nasal saline rinses before nose sprays such as with Neilmed Sinus Rinse.  Use distilled water.   - Use Azelastine 2 sprays each nostril twice daily as needed for runny nose, drainage, sneezing, congestion. Aim upward and outward. - Use Xyzal 5 mg daily.  - For eyes, use Olopatadine 0.2% 1 eye drop daily as needed for itchy, watery eyes.  Available over the  counter, if not covered by insurance.  - Continue allergy shots weekly. Bring Epipen  with each visit and wait 30 minutes after shots.  Initiated 12/2024.     Return in about 6 months (around 06/07/2025).  Arleta Blanch, MD [facility]       "

## 2024-12-11 ENCOUNTER — Ambulatory Visit: Admitting: Internal Medicine

## 2024-12-11 ENCOUNTER — Ambulatory Visit

## 2024-12-14 ENCOUNTER — Ambulatory Visit

## 2024-12-14 DIAGNOSIS — J302 Other seasonal allergic rhinitis: Secondary | ICD-10-CM

## 2024-12-22 ENCOUNTER — Ambulatory Visit (INDEPENDENT_AMBULATORY_CARE_PROVIDER_SITE_OTHER)

## 2024-12-22 DIAGNOSIS — J302 Other seasonal allergic rhinitis: Secondary | ICD-10-CM | POA: Diagnosis not present

## 2024-12-22 DIAGNOSIS — J3089 Other allergic rhinitis: Secondary | ICD-10-CM

## 2024-12-29 ENCOUNTER — Ambulatory Visit

## 2024-12-29 DIAGNOSIS — J302 Other seasonal allergic rhinitis: Secondary | ICD-10-CM | POA: Diagnosis not present

## 2025-01-05 ENCOUNTER — Ambulatory Visit

## 2025-01-05 DIAGNOSIS — J302 Other seasonal allergic rhinitis: Secondary | ICD-10-CM | POA: Diagnosis not present

## 2025-06-07 ENCOUNTER — Ambulatory Visit: Admitting: Internal Medicine
